# Patient Record
Sex: Female | Born: 1935 | Race: White | Hispanic: No | Smoking: Current every day smoker
Health system: Southern US, Community
[De-identification: ages and names within clinical notes are randomized; demographics above are authoritative.]

## PROBLEM LIST (undated history)

## (undated) DIAGNOSIS — M810 Age-related osteoporosis without current pathological fracture: Secondary | ICD-10-CM

## (undated) DIAGNOSIS — G4700 Insomnia, unspecified: Secondary | ICD-10-CM

## (undated) DIAGNOSIS — H3560 Retinal hemorrhage, unspecified eye: Secondary | ICD-10-CM

## (undated) DIAGNOSIS — M1990 Unspecified osteoarthritis, unspecified site: Secondary | ICD-10-CM

## (undated) DIAGNOSIS — F329 Major depressive disorder, single episode, unspecified: Secondary | ICD-10-CM

## (undated) DIAGNOSIS — E119 Type 2 diabetes mellitus without complications: Secondary | ICD-10-CM

## (undated) DIAGNOSIS — E785 Hyperlipidemia, unspecified: Secondary | ICD-10-CM

## (undated) DIAGNOSIS — E278 Other specified disorders of adrenal gland: Secondary | ICD-10-CM

## (undated) DIAGNOSIS — F32A Depression, unspecified: Secondary | ICD-10-CM

## (undated) DIAGNOSIS — J449 Chronic obstructive pulmonary disease, unspecified: Secondary | ICD-10-CM

## (undated) HISTORY — PX: BACK SURGERY: SHX140

## (undated) HISTORY — PX: CATARACT EXTRACTION, BILATERAL: SHX1313

## (undated) HISTORY — PX: GALLBLADDER SURGERY: SHX652

## (undated) HISTORY — PX: TONSILLECTOMY: SUR1361

## (undated) HISTORY — PX: APPENDECTOMY: SHX54

---

## 2004-07-31 ENCOUNTER — Ambulatory Visit: Payer: Self-pay | Admitting: Unknown Physician Specialty

## 2004-08-22 ENCOUNTER — Ambulatory Visit: Payer: Self-pay | Admitting: Internal Medicine

## 2005-07-22 ENCOUNTER — Ambulatory Visit: Payer: Self-pay

## 2005-08-13 ENCOUNTER — Ambulatory Visit: Payer: Self-pay | Admitting: Unknown Physician Specialty

## 2005-08-20 ENCOUNTER — Inpatient Hospital Stay: Payer: Self-pay | Admitting: Unknown Physician Specialty

## 2006-05-26 ENCOUNTER — Ambulatory Visit: Payer: Self-pay | Admitting: Gastroenterology

## 2006-07-02 ENCOUNTER — Ambulatory Visit: Payer: Self-pay | Admitting: Internal Medicine

## 2008-07-27 ENCOUNTER — Emergency Department: Payer: Self-pay | Admitting: Emergency Medicine

## 2008-08-01 ENCOUNTER — Ambulatory Visit: Payer: Self-pay | Admitting: Internal Medicine

## 2009-01-30 ENCOUNTER — Ambulatory Visit: Payer: Self-pay | Admitting: Unknown Physician Specialty

## 2009-09-02 ENCOUNTER — Ambulatory Visit: Payer: Self-pay | Admitting: Unknown Physician Specialty

## 2010-06-06 ENCOUNTER — Ambulatory Visit: Payer: Self-pay | Admitting: Internal Medicine

## 2011-06-18 ENCOUNTER — Ambulatory Visit: Payer: Self-pay

## 2011-10-30 ENCOUNTER — Ambulatory Visit: Payer: Self-pay | Admitting: Gastroenterology

## 2012-06-10 ENCOUNTER — Emergency Department: Payer: Self-pay | Admitting: Emergency Medicine

## 2012-09-14 ENCOUNTER — Ambulatory Visit: Payer: Self-pay | Admitting: Ophthalmology

## 2013-03-22 ENCOUNTER — Encounter: Payer: Self-pay | Admitting: Unknown Physician Specialty

## 2013-04-05 ENCOUNTER — Encounter: Payer: Self-pay | Admitting: Unknown Physician Specialty

## 2013-05-06 ENCOUNTER — Encounter: Payer: Self-pay | Admitting: Unknown Physician Specialty

## 2014-01-05 ENCOUNTER — Emergency Department: Payer: Self-pay | Admitting: Emergency Medicine

## 2014-01-05 LAB — CBC WITH DIFFERENTIAL/PLATELET
Basophil #: 0 10*3/uL (ref 0.0–0.1)
Basophil %: 0.8 %
Eosinophil #: 0.1 10*3/uL (ref 0.0–0.7)
Eosinophil %: 1.6 %
HCT: 43.4 % (ref 35.0–47.0)
HGB: 14.2 g/dL (ref 12.0–16.0)
Lymphocyte #: 2.4 10*3/uL (ref 1.0–3.6)
Lymphocyte %: 42.5 %
MCH: 30.6 pg (ref 26.0–34.0)
MCHC: 32.8 g/dL (ref 32.0–36.0)
MCV: 93 fL (ref 80–100)
MONOS PCT: 10.8 %
Monocyte #: 0.6 x10 3/mm (ref 0.2–0.9)
NEUTROS ABS: 2.5 10*3/uL (ref 1.4–6.5)
NEUTROS PCT: 44.3 %
Platelet: 200 10*3/uL (ref 150–440)
RBC: 4.65 10*6/uL (ref 3.80–5.20)
RDW: 13.2 % (ref 11.5–14.5)
WBC: 5.6 10*3/uL (ref 3.6–11.0)

## 2014-01-05 LAB — URINALYSIS, COMPLETE
Blood: NEGATIVE
Glucose,UR: 50 mg/dL (ref 0–75)
Ketone: NEGATIVE
Nitrite: NEGATIVE
PROTEIN: NEGATIVE
Ph: 7 (ref 4.5–8.0)
RBC,UR: 5 /HPF (ref 0–5)
Specific Gravity: 1.013 (ref 1.003–1.030)
Squamous Epithelial: NONE SEEN
WBC UR: 7 /HPF (ref 0–5)

## 2014-01-05 LAB — COMPREHENSIVE METABOLIC PANEL
ALT: 22 U/L (ref 12–78)
Albumin: 3.7 g/dL (ref 3.4–5.0)
Alkaline Phosphatase: 80 U/L
Anion Gap: 3 — ABNORMAL LOW (ref 7–16)
BUN: 12 mg/dL (ref 7–18)
Bilirubin,Total: 0.5 mg/dL (ref 0.2–1.0)
CO2: 32 mmol/L (ref 21–32)
CREATININE: 0.59 mg/dL — AB (ref 0.60–1.30)
Calcium, Total: 9.6 mg/dL (ref 8.5–10.1)
Chloride: 106 mmol/L (ref 98–107)
EGFR (African American): >60
EGFR (Non-African Amer.): >60
GLUCOSE: 70 mg/dL (ref 65–99)
OSMOLALITY: 279 (ref 275–301)
Potassium: 3.5 mmol/L (ref 3.5–5.1)
SGOT(AST): 27 U/L (ref 15–37)
Sodium: 141 mmol/L (ref 136–145)
Total Protein: 8 g/dL (ref 6.4–8.2)

## 2014-01-05 LAB — TROPONIN I

## 2014-01-05 LAB — LIPASE, BLOOD: LIPASE: 228 U/L (ref 73–393)

## 2014-01-06 LAB — URINE CULTURE

## 2014-05-17 LAB — CBC WITH DIFFERENTIAL/PLATELET
Basophil #: 0.2 10*3/uL — ABNORMAL HIGH (ref 0.0–0.1)
Basophil %: 1.3 %
Eosinophil #: 0 10*3/uL (ref 0.0–0.7)
Eosinophil %: 0.1 %
HCT: 47.6 % — AB (ref 35.0–47.0)
HGB: 15.6 g/dL (ref 12.0–16.0)
Lymphocyte #: 0.2 10*3/uL — ABNORMAL LOW (ref 1.0–3.6)
Lymphocyte %: 1.4 %
MCH: 30.9 pg (ref 26.0–34.0)
MCHC: 32.8 g/dL (ref 32.0–36.0)
MCV: 94 fL (ref 80–100)
MONO ABS: 0.9 x10 3/mm (ref 0.2–0.9)
Monocyte %: 5.3 %
Neutrophil #: 15.3 10*3/uL — ABNORMAL HIGH (ref 1.4–6.5)
Neutrophil %: 91.9 %
Platelet: 181 10*3/uL (ref 150–440)
RBC: 5.04 10*6/uL (ref 3.80–5.20)
RDW: 13.9 % (ref 11.5–14.5)
WBC: 16.6 10*3/uL — ABNORMAL HIGH (ref 3.6–11.0)

## 2014-05-17 LAB — COMPREHENSIVE METABOLIC PANEL
ALT: 22 U/L
ANION GAP: 11 (ref 7–16)
Albumin: 3.5 g/dL (ref 3.4–5.0)
Alkaline Phosphatase: 73 U/L
BILIRUBIN TOTAL: 0.7 mg/dL (ref 0.2–1.0)
BUN: 18 mg/dL (ref 7–18)
Calcium, Total: 9.1 mg/dL (ref 8.5–10.1)
Chloride: 106 mmol/L (ref 98–107)
Co2: 24 mmol/L (ref 21–32)
Creatinine: 0.73 mg/dL (ref 0.60–1.30)
EGFR (Non-African Amer.): >60
Glucose: 267 mg/dL — ABNORMAL HIGH (ref 65–99)
Osmolality: 293 (ref 275–301)
POTASSIUM: 4 mmol/L (ref 3.5–5.1)
SGOT(AST): 27 U/L (ref 15–37)
SODIUM: 141 mmol/L (ref 136–145)
TOTAL PROTEIN: 7.9 g/dL (ref 6.4–8.2)

## 2014-05-17 LAB — LIPASE, BLOOD: LIPASE: 103 U/L (ref 73–393)

## 2014-05-18 ENCOUNTER — Inpatient Hospital Stay: Payer: Self-pay | Admitting: Internal Medicine

## 2014-05-18 LAB — CBC WITH DIFFERENTIAL/PLATELET
Basophil #: 0 10*3/uL (ref 0.0–0.1)
Basophil %: 0.3 %
Eosinophil #: 0 10*3/uL (ref 0.0–0.7)
Eosinophil %: 0 %
HCT: 39.9 % (ref 35.0–47.0)
HGB: 13.3 g/dL (ref 12.0–16.0)
Lymphocyte #: 0.5 10*3/uL — ABNORMAL LOW (ref 1.0–3.6)
Lymphocyte %: 4.1 %
MCH: 31.2 pg (ref 26.0–34.0)
MCHC: 33.3 g/dL (ref 32.0–36.0)
MCV: 94 fL (ref 80–100)
Monocyte #: 0.6 x10 3/mm (ref 0.2–0.9)
Monocyte %: 4.8 %
Neutrophil #: 11.2 10*3/uL — ABNORMAL HIGH (ref 1.4–6.5)
Neutrophil %: 90.8 %
Platelet: 173 10*3/uL (ref 150–440)
RBC: 4.26 10*6/uL (ref 3.80–5.20)
RDW: 14 % (ref 11.5–14.5)
WBC: 12.4 10*3/uL — ABNORMAL HIGH (ref 3.6–11.0)

## 2014-05-18 LAB — URINALYSIS, COMPLETE
Bacteria: NONE SEEN
Bilirubin,UR: NEGATIVE
Glucose,UR: 50 mg/dL (ref 0–75)
Leukocyte Esterase: NEGATIVE
Nitrite: NEGATIVE
Ph: 5 (ref 4.5–8.0)
Protein: NEGATIVE
RBC,UR: NONE SEEN /HPF (ref 0–5)
Specific Gravity: 1.01 (ref 1.003–1.030)
Squamous Epithelial: NONE SEEN
WBC UR: NONE SEEN /HPF (ref 0–5)

## 2014-05-18 LAB — CLOSTRIDIUM DIFFICILE(ARMC)

## 2014-05-22 LAB — STOOL CULTURE

## 2014-05-23 LAB — CULTURE, BLOOD (SINGLE)

## 2014-09-04 ENCOUNTER — Ambulatory Visit: Payer: Self-pay | Admitting: Gastroenterology

## 2015-01-27 NOTE — H&P (Signed)
PATIENT NAME:  Katrina Jefferson, Katrina Jefferson MR#:  [id number] DATE OF BIRTH:  [birth year]  DATE OF ADMISSION:  05/18/2014  REFERRING PHYSICIAN: Conni Slipper, MD  PRIMARY CARE DOCTOR: Glendon Axe, MD  ADMISSION DIAGNOSIS: Gastroenteritis and sepsis.   HISTORY OF PRESENT ILLNESS: This is an 81 year old Caucasian female who presents to the Emergency Department with nausea and vomiting. The patient states that she had nonbloody, nonbilious emesis approximately 6 times this morning. She developed diarrhea as well, and had approximately 10 to 12 loose stools. She denies seeing frank blood or any dark-colored stools. She had been in her usual state of health until this morning. She states that her niece, with whom she lives, has had similar symptoms for the last 2 days. Now the patient states that she feels well, but the Emergency Department staff called the hospitalist service due to concern for possible sepsis.    REVIEW OF SYSTEMS:  CONSTITUTIONAL: The patient denies fever or weight loss.  EARS, NOSE, AND THROAT: The patient denies decreased visual acuity, but admits to persistent right-sided nasal congestion.  RESPIRATORY: The patient denies cough or shortness of breath.  CARDIOVASCULAR: The patient denies chest pain or palpitations.  GASTROINTESTINAL: The patient admits to nausea and vomiting, as well as diarrhea. She had experienced some abdominal pain, but this has resolved.  GENITOURINARY: The patient denies dysuria, increased frequency, or hesitancy.  MUSCULOSKELETAL: The patient denies myalgias, but admits to chronic joint pain.  SKIN: The patient admits to some lesions that have recently been removed and found to be benign by her dermatologist. She denies any rash at this time.  NEUROLOGIC: The patient denies headache, weakness, or dizziness.  PSYCHIATRIC: The patient denies suicidal ideation or homicidal ideation.    PAST MEDICAL HISTORY: Hyperlipidemia, osteoarthritis, COPD, and she has a known  left adrenal mass.   PAST SURGICAL HISTORY: Back surgery, cholecystectomy, shoulder arthroscopy with repair, cataract surgery and benign esophageal stricture dilatation.   MEDICATIONS:  1.  Temazepam 7.5 mg 1 capsule p.o. daily at bedtime as needed.  2.  Simvastatin 80 mg 1 tablet p.o. q.h.s. 3.  Pantoprazole 40 mg 1 tablet p.o. daily.  4.  Metformin 500 mg 1/2 tablet b.i.d.  5.  Lorazepam 0.5 mg 1 tablet p.o. at bedtime. (This must be a substitute for temazepam, as the patient would not have both of these concurrently.)  6.  Loratadine 10 mg 1 tablet p.o. daily.  7.  Etodolac 500 mg 1 tablet p.o. b.i.d.   ALLERGIES: SULFA DRUGS, CODEINE, AS WELL AS ERYTHROMYCIN.   PERTINENT LABORATORY RESULTS AND RADIOLOGIC FINDINGS: White blood cell count is 16.6 with 91.9% neutrophils. Glucose 267. Urinalysis negative.   CT of the abdomen shows a left adrenal adenoma and bilateral renal cysts. There are sigmoid diverticula, but no diverticulitis. There is no evidence of small bowel obstruction. The patient does have a hiatal hernia.   Chest x-ray shows no consolidation, but some diffuse bronchial cuffing, which may suggest acute bronchitis.    PHYSICAL EXAMINATION:  VITAL SIGNS: Temperature 97.5, pulse 107, respiratory rate 22, blood pressure 118/61, pulse oximetry 96% on 2 L of oxygen via nasal cannula.  GENERAL: The patient is alert and oriented x 3, and in no apparent distress.  HEENT: Normocephalic, atraumatic. Extraocular movements are intact. Pupils are equal, round, and reactive to light and accommodation.  She has moist mucous membranes. No oral exudates or erythema in the oropharynx.  NECK: Trachea is midline. No adenopathy.  CHEST: Symmetric and atraumatic.  CARDIOVASCULAR: Regular  rate and rhythm. Normal S1, S2. No rubs, clicks, or murmurs.  LUNGS: Clear to auscultation bilaterally. Normal effort and excursion.  ABDOMEN: Positive bowel sounds. Soft, nontender, nondistended. No  hepatosplenomegaly.  GENITOURINARY: Deferred.  MUSCULOSKELETAL: Has 5/5 strength in upper and lower extremities.   SKIN: No rashes, but the patient does have some healing wounds from which lesions that appear to be seborrheic dermatitis, that had been removed for biopsy.  EXTREMITIES: No clubbing, cyanosis, or edema.  NEUROLOGIC: Cranial nerves II through XII are grossly intact.  PSYCHIATRIC: Mood is normal. Affect is congruent.   ASSESSMENT AND PLAN: This is an 81 year old female with what appears to be a gastroenteritis.  1.  Gastroenteritis. Symptoms appear to have resolved and she is nontoxic-appearing; however, her CBC differential indicates this may be bacterial in origin. Thankfully she does not have any blood in her stool, nor does she have history of exposure to undercooked foods. I will continue ceftriaxone and vancomycin. Initially, the patient was n.p.o. for her imaging studies, but we will advance her diet as tolerated.  2.  Sepsis. The patient is afebrile, but she is tachycardic and tachypneic with this leukocytosis. She looks well, and her vital signs are stable at this point. I have been concerned about her respiratory rate. I have obtained a chest x-ray, which does not show any acute concern at this time. The peribronchial cuffing seems consistent with a viral etiology, which would make sense in that many viral gastroenteritis organisms are spread via respiratory droplets; however, we must still observe the patient for any type of sepsis.  3.  Deep vein thrombosis prophylaxis will be SCDs.  4.  Gastrointestinal prophylaxis. We will continue the patient's home medicines.   CODE STATUS: The patient is a Full Code.   TIME SPENT ON ADMISSION ORDERS AND PATIENT CARE: Approximately 30 minutes.    ____________________________ Norva Riffle. Marcille Blanco, MD msd:MT D: 05/18/2014 05:34:17 ET T: 05/18/2014 06:30:07 ET JOB#: [id number]  cc: Norva Riffle. Marcille Blanco, MD, <Dictator> Norva Riffle Tremon Sainvil  MD ELECTRONICALLY SIGNED 05/18/2014 7:57

## 2015-08-16 ENCOUNTER — Other Ambulatory Visit: Payer: Self-pay | Admitting: Gastroenterology

## 2015-08-16 DIAGNOSIS — R1310 Dysphagia, unspecified: Secondary | ICD-10-CM

## 2015-08-22 ENCOUNTER — Ambulatory Visit
Admission: RE | Admit: 2015-08-22 | Discharge: 2015-08-22 | Disposition: A | Discharge status: Home / Self Care | Payer: Medicare Other | Source: Ambulatory Visit | Attending: Gastroenterology | Admitting: Gastroenterology

## 2015-08-22 DIAGNOSIS — Z9049 Acquired absence of other specified parts of digestive tract: Secondary | ICD-10-CM | POA: Insufficient documentation

## 2015-08-22 DIAGNOSIS — K228 Other specified diseases of esophagus: Secondary | ICD-10-CM | POA: Insufficient documentation

## 2015-08-22 DIAGNOSIS — R1310 Dysphagia, unspecified: Secondary | ICD-10-CM | POA: Diagnosis present

## 2015-08-22 DIAGNOSIS — K449 Diaphragmatic hernia without obstruction or gangrene: Secondary | ICD-10-CM | POA: Insufficient documentation

## 2015-09-14 ENCOUNTER — Encounter: Payer: Self-pay | Admitting: *Deleted

## 2015-09-17 ENCOUNTER — Ambulatory Visit: Payer: Medicare Other | Admitting: Anesthesiology

## 2015-09-17 ENCOUNTER — Encounter
Admission: RE | Disposition: A | Discharge status: Home / Self Care | Payer: Self-pay | Source: Ambulatory Visit | Attending: Gastroenterology

## 2015-09-17 ENCOUNTER — Ambulatory Visit
Admission: RE | Admit: 2015-09-17 | Discharge: 2015-09-17 | Disposition: A | Discharge status: Home / Self Care | Payer: Medicare Other | Source: Ambulatory Visit | Attending: Gastroenterology | Admitting: Gastroenterology

## 2015-09-17 DIAGNOSIS — Z882 Allergy status to sulfonamides status: Secondary | ICD-10-CM | POA: Insufficient documentation

## 2015-09-17 DIAGNOSIS — E119 Type 2 diabetes mellitus without complications: Secondary | ICD-10-CM | POA: Diagnosis not present

## 2015-09-17 DIAGNOSIS — K449 Diaphragmatic hernia without obstruction or gangrene: Secondary | ICD-10-CM | POA: Insufficient documentation

## 2015-09-17 DIAGNOSIS — F329 Major depressive disorder, single episode, unspecified: Secondary | ICD-10-CM | POA: Insufficient documentation

## 2015-09-17 DIAGNOSIS — F17210 Nicotine dependence, cigarettes, uncomplicated: Secondary | ICD-10-CM | POA: Diagnosis not present

## 2015-09-17 DIAGNOSIS — J449 Chronic obstructive pulmonary disease, unspecified: Secondary | ICD-10-CM | POA: Insufficient documentation

## 2015-09-17 DIAGNOSIS — K222 Esophageal obstruction: Secondary | ICD-10-CM | POA: Diagnosis present

## 2015-09-17 DIAGNOSIS — E785 Hyperlipidemia, unspecified: Secondary | ICD-10-CM | POA: Insufficient documentation

## 2015-09-17 DIAGNOSIS — Z7984 Long term (current) use of oral hypoglycemic drugs: Secondary | ICD-10-CM | POA: Insufficient documentation

## 2015-09-17 DIAGNOSIS — Z79899 Other long term (current) drug therapy: Secondary | ICD-10-CM | POA: Diagnosis not present

## 2015-09-17 DIAGNOSIS — R1310 Dysphagia, unspecified: Secondary | ICD-10-CM | POA: Insufficient documentation

## 2015-09-17 DIAGNOSIS — Z886 Allergy status to analgesic agent status: Secondary | ICD-10-CM | POA: Diagnosis not present

## 2015-09-17 DIAGNOSIS — Z885 Allergy status to narcotic agent status: Secondary | ICD-10-CM | POA: Diagnosis not present

## 2015-09-17 DIAGNOSIS — Z881 Allergy status to other antibiotic agents status: Secondary | ICD-10-CM | POA: Insufficient documentation

## 2015-09-17 HISTORY — DX: Chronic obstructive pulmonary disease, unspecified: J44.9

## 2015-09-17 HISTORY — DX: Retinal hemorrhage, unspecified eye: H35.60

## 2015-09-17 HISTORY — DX: Other specified disorders of adrenal gland: E27.8

## 2015-09-17 HISTORY — DX: Depression, unspecified: F32.A

## 2015-09-17 HISTORY — DX: Type 2 diabetes mellitus without complications: E11.9

## 2015-09-17 HISTORY — DX: Unspecified osteoarthritis, unspecified site: M19.90

## 2015-09-17 HISTORY — DX: Age-related osteoporosis without current pathological fracture: M81.0

## 2015-09-17 HISTORY — DX: Hyperlipidemia, unspecified: E78.5

## 2015-09-17 HISTORY — DX: Major depressive disorder, single episode, unspecified: F32.9

## 2015-09-17 HISTORY — DX: Insomnia, unspecified: G47.00

## 2015-09-17 HISTORY — PX: ESOPHAGOGASTRODUODENOSCOPY (EGD) WITH PROPOFOL: SHX5813

## 2015-09-17 LAB — GLUCOSE, CAPILLARY: Glucose-Capillary: 125 mg/dL — ABNORMAL HIGH (ref 65–99)

## 2015-09-17 SURGERY — ESOPHAGOGASTRODUODENOSCOPY (EGD) WITH PROPOFOL
Anesthesia: General

## 2015-09-17 MED ORDER — LABETALOL HCL 5 MG/ML IV SOLN
INTRAVENOUS | Status: DC | PRN
  Administered 2015-09-17: 5 mg via INTRAVENOUS

## 2015-09-17 MED ORDER — SODIUM CHLORIDE 0.9 % IV SOLN
INTRAVENOUS | Status: DC
  Administered 2015-09-17: 1000 mL via INTRAVENOUS

## 2015-09-17 MED ORDER — PROPOFOL 10 MG/ML IV BOLUS
INTRAVENOUS | Status: DC | PRN
  Administered 2015-09-17: 10 mg via INTRAVENOUS
  Administered 2015-09-17 (×2): 20 mg via INTRAVENOUS
  Administered 2015-09-17 (×4): 10 mg via INTRAVENOUS
  Administered 2015-09-17: 20 mg via INTRAVENOUS
  Administered 2015-09-17: 10 mg via INTRAVENOUS

## 2015-09-17 NOTE — Transfer of Care (Signed)
Immediate Anesthesia Transfer of Care Note  Patient: Katrina Jefferson  Procedure(s) Performed: Procedure(s): ESOPHAGOGASTRODUODENOSCOPY (EGD) WITH PROPOFOL (N/A)  Patient Location: PACU  Anesthesia Type:General  Level of Consciousness: awake, alert  and oriented  Airway & Oxygen Therapy: Patient Spontanous Breathing and Patient connected to nasal cannula oxygen  Post-op Assessment: Report given to RN and Post -op Vital signs reviewed and stable  Post vital signs: Reviewed and stable  Last Vitals:  Filed Vitals:   09/17/15 0924  BP: 162/80  Pulse: 77  Temp: 36 C  Resp: 16    Complications: No apparent anesthesia complications

## 2015-09-17 NOTE — H&P (Signed)
Primary Care Physician:  Glendon Axe, MD  Pre-Procedure History & Physical: HPI:  Katrina Jefferson is a 81 y.o. female is here for an endoscopy.   Past Medical History  Diagnosis Date  . COPD (chronic obstructive pulmonary disease) ([place])   . Arthritis   . Depression   . Diabetes mellitus without complication ([place])   . Hyperlipidemia   . Insomnia   . Adrenal mass, left ([place])   . Osteoporosis   . Retinal hemorrhage     History reviewed. No pertinent past surgical history.  Prior to Admission medications   Medication Sig Start Date End Date Taking? Authorizing Provider  acetaminophen (TYLENOL) 650 MG CR tablet Take 1,300 mg by mouth 2 (two) times daily.   Yes Historical Provider, MD  clobetasol cream (TEMOVATE) [id number] % Apply 1 application topically 2 (two) times daily.   Yes Historical Provider, MD  escitalopram (LEXAPRO) 10 MG tablet Take 10 mg by mouth daily.   Yes Historical Provider, MD  fluticasone (FLONASE) 50 MCG/ACT nasal spray Place into both nostrils daily.   Yes Historical Provider, MD  HYDROcodone-acetaminophen (NORCO/VICODIN) 5-325 MG tablet Take 1 tablet by mouth 2 (two) times daily.   Yes Historical Provider, MD  Loratadine (CLARITIN) 10 MG CAPS Take 10 mg by mouth daily.   Yes Historical Provider, MD  metFORMIN (GLUCOPHAGE) 500 MG tablet Take by mouth 2 (two) times daily with a meal.   Yes Historical Provider, MD  omeprazole (PRILOSEC) 40 MG capsule Take 40 mg by mouth daily.   Yes Historical Provider, MD  promethazine (PHENERGAN) 25 MG tablet Take 25 mg by mouth at bedtime as needed for nausea or vomiting.   Yes Historical Provider, MD  ranitidine (ZANTAC) 75 MG tablet Take 75 mg by mouth 2 (two) times daily.   Yes Historical Provider, MD  simvastatin (ZOCOR) 40 MG tablet Take 40 mg by mouth daily.   Yes Historical Provider, MD  temazepam (RESTORIL) 15 MG capsule Take 15 mg by mouth at bedtime as needed for sleep.   Yes Historical Provider, MD    Allergies as  of 09/14/2015 - Review Complete 09/14/2015  Allergen Reaction Noted  . Codeine sulfate  09/14/2015  . Erythromycin  09/14/2015  . Sulfa antibiotics  09/14/2015  . Ultracet [tramadol-acetaminophen]  09/14/2015    History reviewed. No pertinent family history.  Social History   Social History  . Marital Status: Widowed    Spouse Name: N/A  . Number of Children: N/A  . Years of Education: N/A   Occupational History  . Not on file.   Social History Main Topics  . Smoking status: Current Every Day Smoker -- 0.50 packs/day  . Smokeless tobacco: Never Used  . Alcohol Use: No  . Drug Use: No  . Sexual Activity: Not on file   Other Topics Concern  . Not on file   Social History Narrative  . No narrative on file     Physical Exam: BP 162/80 mmHg  Pulse 77  Temp(Src) 96.8 F (36 C) (Tympanic)  Resp 16  Ht 5' 0.5" (1.537 m)  Wt 63.05 kg (139 lb)  BMI 26.69 kg/m2  SpO2 96% General:   Alert,  pleasant and cooperative in NAD Head:  Normocephalic and atraumatic. Neck:  Supple; no masses or thyromegaly. Lungs:  Clear throughout to auscultation.    Heart:  Regular rate and rhythm. Abdomen:  Soft, nontender and nondistended. Normal bowel sounds, without guarding, and without rebound.   Neurologic:  Alert and  oriented x4;  grossly normal neurologically.  Impression/Plan: Katrina Jefferson is here for an endoscopy to be performed for dysphagia, Schatzki Ring on barium swallow  Risks, benefits, limitations, and alternatives regarding  endoscopy have been reviewed with the patient.  Questions have been answered.  All parties agreeable.   Josefine Class, MD  09/17/2015, 10:39 AM

## 2015-09-17 NOTE — Anesthesia Preprocedure Evaluation (Signed)
Anesthesia Evaluation  Patient identified by MRN, date of birth, ID band Patient awake    Reviewed: Allergy & Precautions, NPO status , Patient's Chart, lab work & pertinent test results  Airway Mallampati: II       Dental  (+) Upper Dentures   Pulmonary COPD, Current Smoker,     + decreased breath sounds      Cardiovascular Exercise Tolerance: Good  Rhythm:Regular     Neuro/Psych    GI/Hepatic negative GI ROS, Neg liver ROS,   Endo/Other  diabetes, Type 2, Oral Hypoglycemic Agents  Renal/GU negative Renal ROS     Musculoskeletal   Abdominal   Peds  Hematology   Anesthesia Other Findings   Reproductive/Obstetrics                             Anesthesia Physical Anesthesia Plan  ASA: III  Anesthesia Plan: General   Post-op Pain Management:    Induction: Intravenous  Airway Management Planned: Nasal Cannula  Additional Equipment:   Intra-op Plan:   Post-operative Plan:   Informed Consent: I have reviewed the patients History and Physical, chart, labs and discussed the procedure including the risks, benefits and alternatives for the proposed anesthesia with the patient or authorized representative who has indicated his/her understanding and acceptance.     Plan Discussed with: CRNA  Anesthesia Plan Comments:         Anesthesia Quick Evaluation

## 2015-09-17 NOTE — Anesthesia Postprocedure Evaluation (Signed)
Anesthesia Post Note  Patient: Katrina Jefferson  Procedure(s) Performed: Procedure(s) (LRB): ESOPHAGOGASTRODUODENOSCOPY (EGD) WITH PROPOFOL (N/A)  Patient location during evaluation: PACU Anesthesia Type: General Level of consciousness: awake and alert Pain management: pain level controlled Vital Signs Assessment: post-procedure vital signs reviewed and stable Respiratory status: respiratory function stable Cardiovascular status: stable Anesthetic complications: no    Last Vitals:  Filed Vitals:   09/17/15 1112 09/17/15 1120  BP: 154/85 158/69  Pulse: 82 72  Temp: 36.1 C   Resp: 23 23    Last Pain: There were no vitals filed for this visit.               VAN STAVEREN,Logen Fowle

## 2015-09-17 NOTE — Op Note (Signed)
[facility] Gastroenterology Patient Name: Katrina Jefferson Procedure Date: 09/17/2015 10:37 AM MRN: [id number] Account #: [id number] Date of Birth: 11/25/1923 Admit Type: Outpatient Age: 81 Room: [facility] ENDO ROOM 2 Gender: Female Note Status: Finalized Procedure:         Upper GI endoscopy Indications:       Dysphagia, , Abnormal UGI series ( Schatzki Ring),                     Significant improvement with previous dilation. Patient Profile:   This is an 81 year old female. Providers:         Gerrit Heck. Rayann Heman, MD Referring MD:      Glendon Axe (Referring MD) Medicines:         Propofol per Anesthesia Complications:     No immediate complications. Procedure:         Pre-Anesthesia Assessment:                    - Prior to the procedure, a History and Physical was                     performed, and patient medications, allergies and                     sensitivities were reviewed. The patient's tolerance of                     previous anesthesia was reviewed.                    After obtaining informed consent, the endoscope was passed                     under direct vision. Throughout the procedure, the                     patient's blood pressure, pulse, and oxygen saturations                     were monitored continuously. The Endoscope was introduced                     through the mouth, and advanced to the second part of                     duodenum. The upper GI endoscopy was accomplished without                     difficulty. The patient tolerated the procedure well. Findings:      A medium-sized hiatus hernia was present.      A widely patent Schatzki ring (acquired) was found at the       gastroesophageal junction. A TTS dilator was passed through the scope.       Dilation with an 18-19-20 mm x 5.5 cm CRE balloon (to a maximum balloon       size of 20 mm) dilator was performed. Heme produced at 20 mm, no heme at       18 mm.      The stomach was  normal.      The examined duodenum was normal. Impression:        - Medium-sized hiatus hernia.                    - Widely  patent Schatzki ring. Dilated. Small mucosal tear                     with heme produced at 20 mm. No heme or resistance at 18                     mm.                    - Normal stomach.                    - Normal examined duodenum.                    - No specimens collected. Recommendation:    - Observe patient in GI recovery unit.                    - Continue present medications.                    - Cont prilosec 40 mg daily                    - The findings and recommendations were discussed with the                     patient.                    - The findings and recommendations were discussed with the                     patient's family. Procedure Code(s): --- Professional ---                    [phone], Esophagogastroduodenoscopy, flexible, transoral;                     with transendoscopic balloon dilation of esophagus (less                     than 30 mm diameter) Diagnosis Code(s): --- Professional ---                    K44.9, Diaphragmatic hernia without obstruction or gangrene                    K22.2, Esophageal obstruction                    R13.10, Dysphagia, unspecified                    R93.3, Abnormal findings on diagnostic imaging of other                     parts of digestive tract CPT copyright 2014 [employer]. All rights reserved. The codes documented in this report are preliminary and upon coder review may  be revised to meet current compliance requirements. Mellody Life, MD 09/17/2015 11:09:24 AM This report has been signed electronically. Number of Addenda: 0 Note Initiated On: 09/17/2015 10:37 AM      [facility]

## 2015-09-18 ENCOUNTER — Emergency Department
Admission: EM | Admit: 2015-09-18 | Discharge: 2015-09-18 | Disposition: A | Discharge status: Home / Self Care | Payer: Medicare Other | Attending: Student | Admitting: Student

## 2015-09-18 ENCOUNTER — Emergency Department: Payer: Medicare Other

## 2015-09-18 ENCOUNTER — Encounter: Payer: Self-pay | Admitting: Medical Oncology

## 2015-09-18 DIAGNOSIS — R079 Chest pain, unspecified: Secondary | ICD-10-CM | POA: Diagnosis not present

## 2015-09-18 DIAGNOSIS — F17200 Nicotine dependence, unspecified, uncomplicated: Secondary | ICD-10-CM | POA: Diagnosis not present

## 2015-09-18 DIAGNOSIS — R1012 Left upper quadrant pain: Secondary | ICD-10-CM | POA: Diagnosis not present

## 2015-09-18 DIAGNOSIS — Z79899 Other long term (current) drug therapy: Secondary | ICD-10-CM | POA: Diagnosis not present

## 2015-09-18 DIAGNOSIS — Z7951 Long term (current) use of inhaled steroids: Secondary | ICD-10-CM | POA: Diagnosis not present

## 2015-09-18 DIAGNOSIS — R1013 Epigastric pain: Secondary | ICD-10-CM

## 2015-09-18 DIAGNOSIS — E119 Type 2 diabetes mellitus without complications: Secondary | ICD-10-CM | POA: Insufficient documentation

## 2015-09-18 DIAGNOSIS — Z7984 Long term (current) use of oral hypoglycemic drugs: Secondary | ICD-10-CM | POA: Insufficient documentation

## 2015-09-18 DIAGNOSIS — R531 Weakness: Secondary | ICD-10-CM | POA: Diagnosis present

## 2015-09-18 LAB — HEPATIC FUNCTION PANEL
ALBUMIN: 4.2 g/dL (ref 3.5–5.0)
ALK PHOS: 69 U/L (ref 38–126)
ALT: 20 U/L (ref 14–54)
AST: 26 U/L (ref 15–41)
BILIRUBIN TOTAL: 0.4 mg/dL (ref 0.3–1.2)
Bilirubin, Direct: <0.1 mg/dL — ABNORMAL LOW (ref 0.1–0.5)
TOTAL PROTEIN: 7.9 g/dL (ref 6.5–8.1)

## 2015-09-18 LAB — CBC
HEMATOCRIT: 43 % (ref 35.0–47.0)
Hemoglobin: 13.9 g/dL (ref 12.0–16.0)
MCH: 30 pg (ref 26.0–34.0)
MCHC: 32.4 g/dL (ref 32.0–36.0)
MCV: 92.5 fL (ref 80.0–100.0)
PLATELETS: 216 10*3/uL (ref 150–440)
RBC: 4.64 MIL/uL (ref 3.80–5.20)
RDW: 14.1 % (ref 11.5–14.5)
WBC: 4.5 10*3/uL (ref 3.6–11.0)

## 2015-09-18 LAB — BASIC METABOLIC PANEL
Anion gap: 11 (ref 5–15)
BUN: 12 mg/dL (ref 6–20)
CHLORIDE: 99 mmol/L — AB (ref 101–111)
CO2: 30 mmol/L (ref 22–32)
Calcium: 9.9 mg/dL (ref 8.9–10.3)
Creatinine, Ser: 0.48 mg/dL (ref 0.44–1.00)
Glucose, Bld: 159 mg/dL — ABNORMAL HIGH (ref 65–99)
POTASSIUM: 4.2 mmol/L (ref 3.5–5.1)
SODIUM: 140 mmol/L (ref 135–145)

## 2015-09-18 LAB — GLUCOSE, CAPILLARY: Glucose-Capillary: 150 mg/dL — ABNORMAL HIGH (ref 65–99)

## 2015-09-18 LAB — LIPASE, BLOOD: Lipase: 20 U/L (ref 11–51)

## 2015-09-18 LAB — TROPONIN I: Troponin I: <0.03 ng/mL (ref <0.031)

## 2015-09-18 MED ORDER — IOHEXOL 300 MG/ML  SOLN
75.0000 mL | Freq: Once | INTRAMUSCULAR | Status: AC | PRN
  Administered 2015-09-18: 75 mL via INTRAVENOUS

## 2015-09-18 MED ORDER — SUCRALFATE 1 G PO TABS
1.0000 g | ORAL_TABLET | Freq: Once | ORAL | Status: AC
  Administered 2015-09-18: 1 g via ORAL
  Filled 2015-09-18: qty 1

## 2015-09-18 NOTE — ED Notes (Signed)
Patient transported to CT 

## 2015-09-18 NOTE — ED Notes (Signed)
Pt reports she had EGD yesterday with esophagus stretched. Since procedure pt has been having central chest pain that has been so severe pt reports she feels like she is going to pass out.

## 2015-09-18 NOTE — ED Notes (Signed)
Pt in xray

## 2015-09-18 NOTE — ED Notes (Signed)
MD at bedside. 

## 2015-09-18 NOTE — ED Provider Notes (Signed)
[facility] Emergency Department Provider Note  ____________________________________________  Time seen: Approximately 1:16 PM  I have reviewed the triage vital signs and the nursing notes.   HISTORY  Chief Complaint Chest Pain and Weakness    HPI Katrina Jefferson is a 81 y.o. female with hyperlipidemia, arthritis, COPD who presents for evaluation of epigastric and left upper quadrant pain radiating to the back, constant since yesterday, worse last night while trying to sleep, now improved without any intervention, currently moderate. Patient reports that she has had similar pain in the past leading up to her having endoscopy and  dilation of a Schatzki's ring yesterday performed by Dr.Rein of GI. She reports that she had the pain before the procedure however since the procedure, the pain has worsened. Worse when she lies flat, improves with her sitting up. Pain is worse with eating. No nausea, vomiting, diarrhea, fevers or chills. She has not had a bowel movement since the procedure but is able to pass flatus. No chest pain or difficulty breathing.   Past Medical History  Diagnosis Date  . COPD (chronic obstructive pulmonary disease) ([place])   . Arthritis   . Depression   . Diabetes mellitus without complication ([place])   . Hyperlipidemia   . Insomnia   . Adrenal mass, left ([place])   . Osteoporosis   . Retinal hemorrhage     There are no active problems to display for this patient.   History reviewed. No pertinent past surgical history.  Current Outpatient Rx  Name  Route  Sig  Dispense  Refill  . acetaminophen (TYLENOL) 650 MG CR tablet   Oral   Take 1,300 mg by mouth 2 (two) times daily.         . clobetasol cream (TEMOVATE) 0.05 %   Topical   Apply 1 application topically 2 (two) times daily.         Marland Kitchen escitalopram (LEXAPRO) 10 MG tablet   Oral   Take 10 mg by mouth daily.         . fluticasone (FLONASE) 50 MCG/ACT nasal spray   Each  Nare   Place into both nostrils daily.         Marland Kitchen HYDROcodone-acetaminophen (NORCO/VICODIN) 5-325 MG tablet   Oral   Take 1 tablet by mouth 2 (two) times daily.         . Loratadine (CLARITIN) 10 MG CAPS   Oral   Take 10 mg by mouth daily.         . metFORMIN (GLUCOPHAGE) 500 MG tablet   Oral   Take by mouth 2 (two) times daily with a meal.         . omeprazole (PRILOSEC) 40 MG capsule   Oral   Take 40 mg by mouth daily.         . promethazine (PHENERGAN) 25 MG tablet   Oral   Take 25 mg by mouth at bedtime as needed for nausea or vomiting.         . ranitidine (ZANTAC) 75 MG tablet   Oral   Take 75 mg by mouth 2 (two) times daily.         . simvastatin (ZOCOR) 40 MG tablet   Oral   Take 40 mg by mouth daily.         . temazepam (RESTORIL) 15 MG capsule   Oral   Take 15 mg by mouth at bedtime as needed for sleep.  Allergies Codeine sulfate; Erythromycin; Sulfa antibiotics; and Ultracet  No family history on file.  Social History Social History  Substance Use Topics  . Smoking status: Current Every Day Smoker -- 0.50 packs/day  . Smokeless tobacco: Never Used  . Alcohol Use: No    Review of Systems Constitutional: No fever/chills Eyes: No visual changes. ENT: No sore throat. Cardiovascular: Denies chest pain. Respiratory: Denies shortness of breath. Gastrointestinal: + abdominal pain.  No nausea, no vomiting.  No diarrhea.  No constipation. Genitourinary: Negative for dysuria. Musculoskeletal: Negative for back pain. Skin: Negative for rash. Neurological: Negative for headaches, focal weakness or numbness.  10-point ROS otherwise negative.  ____________________________________________   PHYSICAL EXAM:  VITAL SIGNS: ED Triage Vitals  Enc Vitals Group     BP 09/18/15 1200 172/73 mmHg     Pulse Rate 09/18/15 1200 88     Resp 09/18/15 1200 18     Temp 09/18/15 1200 97.8 F (36.6 C)     Temp src --      SpO2 09/18/15  1200 93 %     Weight 09/18/15 1200 139 lb (63.05 kg)     Height --      Head Cir --      Peak Flow --      Pain Score 09/18/15 1201 8     Pain Loc --      Pain Edu? --      Excl. in [place]? --     Constitutional: Alert and oriented. Well appearing and in no acute distress. Eyes: Conjunctivae are normal. PERRL. EOMI. Head: Atraumatic. Nose: No congestion/rhinnorhea. Mouth/Throat: Mucous membranes are moist.  Oropharynx non-erythematous. Neck: No stridor.   Cardiovascular: Normal rate, regular rhythm. Grossly normal heart sounds.  Good peripheral circulation. Respiratory: Normal respiratory effort.  No retractions. Lungs CTAB. Gastrointestinal: Soft, nontender nondistended, slightly diminished bowel sounds. No CVA tenderness. Genitourinary: deferred Musculoskeletal: No lower extremity tenderness nor edema.  No joint effusions. Neurologic:  Normal speech and language. No gross focal neurologic deficits are appreciated. No gait instability. Skin:  Skin is warm, dry and intact. No rash noted. Psychiatric: Mood and affect are normal. Speech and behavior are normal.  ____________________________________________   LABS (all labs ordered are listed, but only abnormal results are displayed)  Labs Reviewed  GLUCOSE, CAPILLARY - Abnormal; Notable for the following:    Glucose-Capillary 150 (*)    All other components within normal limits  BASIC METABOLIC PANEL - Abnormal; Notable for the following:    Chloride 99 (*)    Glucose, Bld 159 (*)    All other components within normal limits  HEPATIC FUNCTION PANEL - Abnormal; Notable for the following:    Bilirubin, Direct <0.1 (*)    All other components within normal limits  CBC  TROPONIN I  LIPASE, BLOOD   ____________________________________________  EKG  ED ECG REPORT I, Joanne Gavel, the attending physician, personally viewed and interpreted this ECG.   Date: 09/18/2015  EKG Time: 11:58  Rate: 88  Rhythm: normal sinus  rhythm  Axis: normal  Intervals:none  ST&T Change: No acute ST elevation. Minimal voltage criteria for LVH.  ____________________________________________  RADIOLOGY  CXR IMPRESSION: 1. Small right pleural effusion and associated airspace disease. This likely reflects atelectasis. 2. Emphysema with chronic interstitial coarsening.  CT chest and abdomen with contrast  FINDINGS: CT CHEST WITH CONTRAST  Mediastinum/Lymph Nodes: No breast masses, supraclavicular or axillary lymphadenopathy. Thyroid goiter with a dominant 2.3 cm right thyroid lesion. Recommend ultrasound correlation  and follow-up. Patient may require an FNA.  The heart is normal in size. No pericardial effusion. Moderate atherosclerotic calcifications involving the aorta but no aneurysm or dissection. The coronary arteries demonstrate atherosclerotic calcifications.  No mediastinal or hilar mass or adenopathy. No abnormal mediastinal fluid collections/hematoma.  Moderate to large hiatal hernia. No findings for esophageal perforation/leak.  Lungs/Pleura: No pneumothorax or pleural effusion. Moderate emphysematous changes and pulmonary scarring. No infiltrates or effusions. No worrisome pulmonary lesions.  Musculoskeletal: Advanced degenerative changes and osteoporosis involving the thoracic spine. No acute bony findings or destructive bony lesions.  CT ABDOMEN WITH CONTRAST  Hepatobiliary: No focal hepatic lesions or intrahepatic biliary dilatation. The gallbladder is surgically absent. No common bile duct dilatation.  Pancreas: No mass, inflammation or ductal dilatation.  Spleen: Normal size. No focal lesions.  Adrenals/Urinary Tract: Stable left adrenal gland nodule most consistent with a benign adenoma. The right adrenal gland is normal. There are small renal cysts. No worrisome renal lesions. A lower pole left renal calculus is noted.  Stomach/Bowel: The stomach, duodenum, small bowel and  colon are unremarkable. No inflammatory changes, mass lesions or obstructive findings. Scattered colonic diverticulosis without findings for acute diverticulitis. There is a large diverticuli near the left kidney which probably contains retained barium.  Vascular/Lymphatic: No mesenteric or retroperitoneal mass or lymphadenopathy. Small scattered lymph nodes are noted. The aorta demonstrates advanced atherosclerotic calcifications. The major branch vessels are patent. The major venous structures are patent.  Other: No ascites or abdominal wall hernia.  Musculoskeletal: Small left-sided chest wall lipoma. Advanced degenerative changes involving the lumbar spine.  IMPRESSION: 1. Moderate to large hiatal hernia. No findings for esophageal perforation. 2. Emphysematous changes but no acute pulmonary findings. 3. Advanced atherosclerotic calcifications involving the major vascular structures of the chest and abdomen. 4. Stable left adrenal gland adenoma. 5. Lower pole left renal calculus. ____________________________________________   PROCEDURES  Procedure(s) performed: None  Critical Care performed: No  ____________________________________________   INITIAL IMPRESSION / ASSESSMENT AND PLAN / ED COURSE  Pertinent labs & imaging results that were available during my care of the patient were reviewed by me and considered in my medical decision making (see chart for details).  Domenic Schwab Hynek is a 81 y.o. female with hyperlipidemia, arthritis, COPD who presents for evaluation of epigastric and left upper quadrant pain radiating to the back, constant since yesterday, worse last night while trying to sleep and worse with eating. On exam, she is very well-appearing and in no acute distress. Vital signs are stable. She is afebrile. Labs reviewed and are generally unremarkable. Troponin negative, EKG not this with acute ischemia and given pain constant since onset, modifying factors  such as lying down in eating, doubt ACS. Additionally troponin is negative and pain has been ongoing and constant for greater than 6 hours. Given procedure yesterday the mostly likely causative factor in terms of worsening pain, I discussed the case with Dr. Rayann Heman of GI who has spoken with radiologist who recommends contrasted CT with Gastrografin to evaluate for complication such as esophageal perforation. Doubt acute aortic dissection but CT will allow eval of aortic contours as well. Chest x-ray is negative. Awaiting CT scan. Reassess for disposition.  ----------------------------------------- 3:34 PM on 09/18/2015 ----------------------------------------- Patient reports she feels well at this time. She is sitting up in bed and in no acute distress. She is not requiring any medication for pain. CT scan of the chest and abdomen shows hiatal hernia, no esophageal perforation, no appreciable aortic aneurysm or dissection.  Atherosclerotic calcifications are noted in involving the major vascular structures of the chest and abdomen however, as above, doubt ACS. We discussed return precautions, need for close PCP and GI follow-up and the patient is comfortable with the discharge plan. DC home. Now, blood pressure is moderately elevated however she's not had any of her home medications today. Encouraged her to take her home antihypertensives.  ____________________________________________   FINAL CLINICAL IMPRESSION(S) / ED DIAGNOSES  Final diagnoses:  Epigastric pain      Joanne Gavel, MD 09/18/15 [id number]

## 2015-09-19 ENCOUNTER — Encounter: Payer: Self-pay | Admitting: Gastroenterology

## 2015-11-15 ENCOUNTER — Encounter: Payer: Self-pay | Admitting: Emergency Medicine

## 2015-11-15 ENCOUNTER — Emergency Department: Payer: Medicare Other

## 2015-11-15 ENCOUNTER — Emergency Department
Admission: EM | Admit: 2015-11-15 | Discharge: 2015-11-15 | Disposition: A | Discharge status: Home / Self Care | Payer: Medicare Other | Attending: Student | Admitting: Student

## 2015-11-15 DIAGNOSIS — Z7984 Long term (current) use of oral hypoglycemic drugs: Secondary | ICD-10-CM | POA: Insufficient documentation

## 2015-11-15 DIAGNOSIS — Y9289 Other specified places as the place of occurrence of the external cause: Secondary | ICD-10-CM | POA: Insufficient documentation

## 2015-11-15 DIAGNOSIS — E119 Type 2 diabetes mellitus without complications: Secondary | ICD-10-CM | POA: Diagnosis not present

## 2015-11-15 DIAGNOSIS — W1839XA Other fall on same level, initial encounter: Secondary | ICD-10-CM | POA: Diagnosis not present

## 2015-11-15 DIAGNOSIS — S60221A Contusion of right hand, initial encounter: Secondary | ICD-10-CM | POA: Insufficient documentation

## 2015-11-15 DIAGNOSIS — W19XXXA Unspecified fall, initial encounter: Secondary | ICD-10-CM

## 2015-11-15 DIAGNOSIS — Z79899 Other long term (current) drug therapy: Secondary | ICD-10-CM | POA: Insufficient documentation

## 2015-11-15 DIAGNOSIS — F17200 Nicotine dependence, unspecified, uncomplicated: Secondary | ICD-10-CM | POA: Insufficient documentation

## 2015-11-15 DIAGNOSIS — S60211A Contusion of right wrist, initial encounter: Secondary | ICD-10-CM | POA: Diagnosis not present

## 2015-11-15 DIAGNOSIS — S5011XA Contusion of right forearm, initial encounter: Secondary | ICD-10-CM | POA: Diagnosis not present

## 2015-11-15 DIAGNOSIS — S52591A Other fractures of lower end of right radius, initial encounter for closed fracture: Secondary | ICD-10-CM | POA: Insufficient documentation

## 2015-11-15 DIAGNOSIS — Y990 Civilian activity done for income or pay: Secondary | ICD-10-CM | POA: Diagnosis not present

## 2015-11-15 DIAGNOSIS — Z7951 Long term (current) use of inhaled steroids: Secondary | ICD-10-CM | POA: Insufficient documentation

## 2015-11-15 DIAGNOSIS — S5291XA Unspecified fracture of right forearm, initial encounter for closed fracture: Secondary | ICD-10-CM

## 2015-11-15 DIAGNOSIS — S6991XA Unspecified injury of right wrist, hand and finger(s), initial encounter: Secondary | ICD-10-CM | POA: Diagnosis present

## 2015-11-15 DIAGNOSIS — Y9389 Activity, other specified: Secondary | ICD-10-CM | POA: Diagnosis not present

## 2015-11-15 NOTE — Discharge Instructions (Signed)
Forearm Fracture °A forearm fracture is a break in one or both of the bones of your arm that are between the elbow and the wrist. Your forearm is made up of two bones called the radius and the ulna. °Some forearm fractures will require surgery. °HOME CARE °If You Have a Cast: °· Do not stick anything inside the cast to scratch your skin. °· Check the skin around the cast every day. Tell your doctor about any concerns. You may put lotion on dry skin around the edges of the cast, but not on the skin underneath the cast. °If You Have a Splint: °· Wear it as told by your doctor. Remove it only as told by your doctor. °· Loosen the splint if your fingers become numb and tingle, or if they turn cold and blue. °Bathing °· Cover the cast or splint with a watertight plastic bag to protect it from water while you take a bath or a shower. Do not let the cast or splint get wet. °Managing Pain, Stiffness, and Swelling °· If told, apply ice to the injured area: °¨ Put ice in a plastic bag. °¨ Place a towel between your skin and the bag. °¨ Leave the ice on for 20 minutes, 2-3 times a day. °· Move your fingers often to avoid stiffness and to lessen swelling. °· Raise the injured area above the level of your heart while you are sitting or lying down. °Driving °· Do not drive or use heavy machinery while taking pain medicine. °· Do not drive while wearing a cast or splint on a hand that you use for driving. °Activity °· Return to your normal activities as told by your doctor. Ask your doctor what activities are safe for you. °· Do range-of-motion exercises only as told by your doctor. °Safety °· Do not use your injured limb to support your body weight until your doctor says that you can. °General Instructions °· Do not put pressure on any part of the cast or splint until it is fully hardened. This may take several hours. °· Keep the cast or splint clean and dry. °· Do not use any tobacco products, including cigarettes, chewing  tobacco, or electronic cigarettes. Tobacco can delay bone healing. If you need help quitting, ask your doctor. °· Take medicines only as told by your doctor. °· Keep all follow-up visits as told by your doctor. This is important. °GET HELP IF: °· Your pain medicine is not helping. °· Your cast breaks or gets damaged. °· Your cast gets loose. °· Your cast feels too tight. °· Your cast gets wet. °· You have more severe pain or swelling than you did before the cast. °· You have severe pain when you stretch your fingers. °· You continue to have pain or stiffness in your elbow or your wrist after your cast is taken off. °GET HELP RIGHT AWAY IF:  °· You cannot move your fingers. °· You lose feeling in your fingers or your hand. °· Your hand or your fingers turn cold and pale or blue. °· You notice a bad smell coming from your cast. °· You have fluid or drainage from underneath your cast. °· You have new stains from blood, fluid, or drainage that is coming through your cast. °  °This information is not intended to replace advice given to you by your health care provider. Make sure you discuss any questions you have with your health care provider. °  °Document Released: 03/10/2008 Document Revised: 10/13/2014 Document   Reviewed: 05/08/2014 Elsevier Interactive Patient Education [employer].

## 2015-11-15 NOTE — ED Provider Notes (Signed)
[facility] Emergency Department Provider Note  ____________________________________________  Time seen: Approximately 1:20 PM  I have reviewed the triage vital signs and the nursing notes.   HISTORY  Chief Complaint Arm Pain    HPI Katrina Jefferson is a 82 y.o. female , NAD, presents to the emergency department with right wrist pain that started after she fell to [place] work on Tuesday. States she placed her hand out in front of her brace her fall. Has been able to move the wrist, hand, fingers but has noted increased bruising and swelling over the last day. Denies numbness, weakness, tingling. Denies head injury, headache, LOC.   Past Medical History  Diagnosis Date  . COPD (chronic obstructive pulmonary disease) ([place])   . Arthritis   . Depression   . Diabetes mellitus without complication ([place])   . Hyperlipidemia   . Insomnia   . Adrenal mass, left ([place])   . Osteoporosis   . Retinal hemorrhage     There are no active problems to display for this patient.   Past Surgical History  Procedure Laterality Date  . Esophagogastroduodenoscopy (egd) with propofol N/A 09/17/2015    Procedure: ESOPHAGOGASTRODUODENOSCOPY (EGD) WITH PROPOFOL;  Surgeon: Josefine Class, MD;  [facility] END[facility]COPY;  Service: Endoscopy;  Laterality: N/A;    Current Outpatient Rx  Name  Route  Sig  Dispense  Refill  . acetaminophen (TYLENOL) 650 MG CR tablet   Oral   Take 1,300 mg by mouth 2 (two) times daily.         . clobetasol cream (TEMOVATE) 0.05 %   Topical   Apply 1 application topically 2 (two) times daily.         Marland Kitchen escitalopram (LEXAPRO) 10 MG tablet   Oral   Take 10 mg by mouth daily.         . fluticasone (FLONASE) 50 MCG/ACT nasal spray   Each Nare   Place into both nostrils daily.         Marland Kitchen HYDROcodone-acetaminophen (NORCO/VICODIN) 5-325 MG tablet   Oral   Take 1 tablet by mouth 2 (two) times daily.         . Loratadine (CLARITIN)  10 MG CAPS   Oral   Take 10 mg by mouth daily.         . metFORMIN (GLUCOPHAGE) 500 MG tablet   Oral   Take by mouth 2 (two) times daily with a meal.         . omeprazole (PRILOSEC) 40 MG capsule   Oral   Take 40 mg by mouth daily.         . promethazine (PHENERGAN) 25 MG tablet   Oral   Take 25 mg by mouth at bedtime as needed for nausea or vomiting.         . ranitidine (ZANTAC) 75 MG tablet   Oral   Take 75 mg by mouth 2 (two) times daily.         . simvastatin (ZOCOR) 40 MG tablet   Oral   Take 40 mg by mouth daily.         . temazepam (RESTORIL) 15 MG capsule   Oral   Take 15 mg by mouth at bedtime as needed for sleep.           Allergies Codeine sulfate; Erythromycin; Sulfa antibiotics; and Ultracet  No family history on file.  Social History Social History  Substance Use Topics  . Smoking status: Current Every  Day Smoker -- 0.50 packs/day  . Smokeless tobacco: Never Used  . Alcohol Use: No     Review of Systems Constitutional: No fever/chills Eyes: No visual changes.  Cardiovascular: No chest pain. Respiratory: No shortness of breath. Musculoskeletal: Positive for right wrist pain. Negative for back pain.  Skin: Positive for bruising and swelling about the right distal forearm, wrist, hand, fingers. Negative for rash nor open wounds. Neurological: Negative for headaches, focal weakness or numbness. 10-point ROS otherwise negative.  ____________________________________________   PHYSICAL EXAM:  VITAL SIGNS: ED Triage Vitals  Enc Vitals Group     BP 11/15/15 1157 156/72 mmHg     Pulse Rate 11/15/15 1157 86     Resp 11/15/15 1157 16     Temp 11/15/15 1157 97.7 F (36.5 C)     Temp Source 11/15/15 1157 Oral     SpO2 11/15/15 1157 96 %     Weight 11/15/15 1157 140 lb (63.504 kg)     Height 11/15/15 1157 5' 2.5" (1.588 m)     Head Cir --      Peak Flow --      Pain Score 11/15/15 1158 6     Pain Loc --      Pain Edu? --       Excl. in [place]? --     Constitutional: Alert and oriented. Well appearing and in no acute distress. Eyes: Conjunctivae are normal. PERRL. EOMI without pain.  Head: Atraumatic. Cardiovascular: Normal rate, regular rhythm. Normal S1 and S2.  Good peripheral circulation about bilateral upper extremities noting pulses to be 2+. Respiratory: Normal respiratory effort without tachypnea or retractions. Lungs CTAB. Musculoskeletal: Positive for ear pain over the distal radius. No crepitus on exam. Full range of motion of right wrist, hand, fingers. Full range of motion of the right elbow without tenderness to palpation.  Neurologic:  Normal speech and language. No gross focal neurologic deficits are appreciated. Sensation without abnormality of bilateral upper extremities. Skin:  Skin about the distal right forearm, wrist, hand, fingers moderately swollen with blue ecchymosis present. No open wounds, lacerations.  Psychiatric: Mood and affect are normal. Speech and behavior are normal. Patient exhibits appropriate insight and judgement.   ____________________________________________   LABS  None  ____________________________________________  EKG  None ____________________________________________  RADIOLOGY I have personally viewed and evaluated these images (plain radiographs) as part of my medical decision making, as well as reviewing the written report by the radiologist.  Dg Wrist Complete Right  11/15/2015  CLINICAL DATA:  Pain following fall 2 days prior EXAM: RIGHT WRIST - COMPLETE 3+ VIEW COMPARISON:  None. FINDINGS: Frontal, oblique, lateral, and ulnar deviation scaphoid images were obtained. There is soft tissue swelling in a generalized manner. Bones are somewhat osteoporotic. There is a subtle transverse lucency in the distal radial metaphysis, best seen on the ulnar deviation scaphoid image. In this region on the lateral view, there is questionable equivocal cortical disruption  dorsally. This finding is concerning for a nondisplaced fracture of the distal radial metaphysis. There is no other evidence of potential fracture. No dislocation. There is advanced osteoarthritic change in the first carpal -metacarpal joint. There is moderate osteoarthritic change in the scaphotrapezial joint. There is calcification in the triangular fibrocartilage. IMPRESSION: Subtle linear lucency in the distal radial metaphysis with underlying osteoporosis. A nondisplaced fracture of the distal radius is of concern given this appearance. No other evidence of potential fracture. Advanced osteoarthritis in the first carpal -metacarpal joint. Calcification in the triangular  fibrocartilage is concerning for potential chronic triangular fibrocartilage tear. There is generalized soft tissue swelling. Electronically Signed   By: Lowella Grip III M.D.   On: 11/15/2015 13:15    ____________________________________________    PROCEDURES  Procedure(s) performed: None    Medications - No data to display   ____________________________________________   INITIAL IMPRESSION / ASSESSMENT AND PLAN / ED COURSE  Pertinent imaging results that were available during my care of the patient were reviewed by me and considered in my medical decision making (see chart for details).  Patient's diagnosis is consistent with distal right radius fracture.  Sugar tong splint applied to the distal right upper extremity. Patient tolerated well without any increasing pain. She was also placed in an arm sling for comfort. May take Tylenol as needed for pain. Patient is to follow up with Dr. Mack Guise in orthopedics tomorrow.  Patient is given ED precautions to return to the ED for any worsening or new symptoms.    ____________________________________________  FINAL CLINICAL IMPRESSION(S) / ED DIAGNOSES  Final diagnoses:  Fracture of right radius, closed, initial encounter  Fall, initial encounter      NEW  MEDICATIONS STARTED DURING THIS VISIT:  Discharge Medication List as of 11/15/2015  1:30 PM           Braxton Feathers, PA-C 11/15/15 Whiteside Gayle, MD 11/15/15 1549

## 2015-11-15 NOTE — ED Notes (Signed)
Patient presents to the ED with right wrist pain after falling doing yard work on Tuesday.  Patient is able to move hand but wrist appears swollen and bruised.  Patient is alert and oriented, no obvious distress.  Denies any other injury.

## 2015-11-27 ENCOUNTER — Observation Stay
Admission: EM | Admit: 2015-11-27 | Discharge: 2015-11-30 | Disposition: A | Discharge status: Skilled Nursing Facility | Payer: Medicare Other | Attending: Internal Medicine | Admitting: Internal Medicine

## 2015-11-27 ENCOUNTER — Emergency Department: Payer: Medicare Other

## 2015-11-27 DIAGNOSIS — M160 Bilateral primary osteoarthritis of hip: Secondary | ICD-10-CM | POA: Diagnosis not present

## 2015-11-27 DIAGNOSIS — Z9889 Other specified postprocedural states: Secondary | ICD-10-CM | POA: Insufficient documentation

## 2015-11-27 DIAGNOSIS — M25561 Pain in right knee: Principal | ICD-10-CM | POA: Insufficient documentation

## 2015-11-27 DIAGNOSIS — Z886 Allergy status to analgesic agent status: Secondary | ICD-10-CM | POA: Diagnosis not present

## 2015-11-27 DIAGNOSIS — R42 Dizziness and giddiness: Secondary | ICD-10-CM | POA: Insufficient documentation

## 2015-11-27 DIAGNOSIS — E119 Type 2 diabetes mellitus without complications: Secondary | ICD-10-CM | POA: Diagnosis not present

## 2015-11-27 DIAGNOSIS — Z882 Allergy status to sulfonamides status: Secondary | ICD-10-CM | POA: Diagnosis not present

## 2015-11-27 DIAGNOSIS — M4690 Unspecified inflammatory spondylopathy, site unspecified: Secondary | ICD-10-CM | POA: Diagnosis not present

## 2015-11-27 DIAGNOSIS — Z833 Family history of diabetes mellitus: Secondary | ICD-10-CM | POA: Insufficient documentation

## 2015-11-27 DIAGNOSIS — Z885 Allergy status to narcotic agent status: Secondary | ICD-10-CM | POA: Diagnosis not present

## 2015-11-27 DIAGNOSIS — M545 Low back pain: Secondary | ICD-10-CM | POA: Insufficient documentation

## 2015-11-27 DIAGNOSIS — J449 Chronic obstructive pulmonary disease, unspecified: Secondary | ICD-10-CM | POA: Diagnosis not present

## 2015-11-27 DIAGNOSIS — Z7984 Long term (current) use of oral hypoglycemic drugs: Secondary | ICD-10-CM | POA: Diagnosis not present

## 2015-11-27 DIAGNOSIS — F329 Major depressive disorder, single episode, unspecified: Secondary | ICD-10-CM | POA: Diagnosis not present

## 2015-11-27 DIAGNOSIS — R0789 Other chest pain: Secondary | ICD-10-CM | POA: Insufficient documentation

## 2015-11-27 DIAGNOSIS — S29009A Unspecified injury of muscle and tendon of unspecified wall of thorax, initial encounter: Secondary | ICD-10-CM | POA: Insufficient documentation

## 2015-11-27 DIAGNOSIS — M25551 Pain in right hip: Secondary | ICD-10-CM | POA: Diagnosis present

## 2015-11-27 DIAGNOSIS — Z881 Allergy status to other antibiotic agents status: Secondary | ICD-10-CM | POA: Diagnosis not present

## 2015-11-27 DIAGNOSIS — E278 Other specified disorders of adrenal gland: Secondary | ICD-10-CM | POA: Diagnosis not present

## 2015-11-27 DIAGNOSIS — M1711 Unilateral primary osteoarthritis, right knee: Secondary | ICD-10-CM | POA: Insufficient documentation

## 2015-11-27 DIAGNOSIS — S0990XA Unspecified injury of head, initial encounter: Secondary | ICD-10-CM | POA: Insufficient documentation

## 2015-11-27 DIAGNOSIS — I10 Essential (primary) hypertension: Secondary | ICD-10-CM | POA: Diagnosis present

## 2015-11-27 DIAGNOSIS — Z818 Family history of other mental and behavioral disorders: Secondary | ICD-10-CM | POA: Diagnosis not present

## 2015-11-27 DIAGNOSIS — E785 Hyperlipidemia, unspecified: Secondary | ICD-10-CM | POA: Diagnosis present

## 2015-11-27 DIAGNOSIS — Z66 Do not resuscitate: Secondary | ICD-10-CM | POA: Diagnosis not present

## 2015-11-27 DIAGNOSIS — Z79899 Other long term (current) drug therapy: Secondary | ICD-10-CM | POA: Insufficient documentation

## 2015-11-27 DIAGNOSIS — S29019A Strain of muscle and tendon of unspecified wall of thorax, initial encounter: Secondary | ICD-10-CM

## 2015-11-27 DIAGNOSIS — F17200 Nicotine dependence, unspecified, uncomplicated: Secondary | ICD-10-CM | POA: Diagnosis not present

## 2015-11-27 DIAGNOSIS — M810 Age-related osteoporosis without current pathological fracture: Secondary | ICD-10-CM | POA: Diagnosis not present

## 2015-11-27 DIAGNOSIS — S39012A Strain of muscle, fascia and tendon of lower back, initial encounter: Secondary | ICD-10-CM | POA: Insufficient documentation

## 2015-11-27 DIAGNOSIS — I639 Cerebral infarction, unspecified: Secondary | ICD-10-CM | POA: Diagnosis not present

## 2015-11-27 DIAGNOSIS — R296 Repeated falls: Secondary | ICD-10-CM | POA: Diagnosis not present

## 2015-11-27 DIAGNOSIS — Z7951 Long term (current) use of inhaled steroids: Secondary | ICD-10-CM | POA: Insufficient documentation

## 2015-11-27 DIAGNOSIS — W19XXXA Unspecified fall, initial encounter: Secondary | ICD-10-CM | POA: Diagnosis not present

## 2015-11-27 DIAGNOSIS — R55 Syncope and collapse: Secondary | ICD-10-CM | POA: Diagnosis not present

## 2015-11-27 DIAGNOSIS — I6523 Occlusion and stenosis of bilateral carotid arteries: Secondary | ICD-10-CM | POA: Insufficient documentation

## 2015-11-27 DIAGNOSIS — Z8249 Family history of ischemic heart disease and other diseases of the circulatory system: Secondary | ICD-10-CM | POA: Insufficient documentation

## 2015-11-27 MED ORDER — ACETAMINOPHEN 325 MG PO TABS
ORAL_TABLET | ORAL | Status: AC
Start: 2015-11-27 — End: 2015-11-27
  Administered 2015-11-27: 975 mg via ORAL
  Filled 2015-11-27: qty 3

## 2015-11-27 MED ORDER — ACETAMINOPHEN 325 MG PO TABS
975.0000 mg | ORAL_TABLET | Freq: Once | ORAL | Status: AC
  Administered 2015-11-27: 975 mg via ORAL

## 2015-11-27 NOTE — ED Notes (Signed)
Pt arrived via EMS c/o Fall. Per EMS pt fell in her kitchen, hitting her head abstaining a hematoma to the lower part of her skull, as well as back pain. Pt describes legs giving away denies tripping over anything and believes she hit her head against the stove but unsure if it hit the ground.

## 2015-11-27 NOTE — ED Notes (Signed)
MD at bedside. 

## 2015-11-28 ENCOUNTER — Encounter: Payer: Self-pay | Admitting: Internal Medicine

## 2015-11-28 ENCOUNTER — Observation Stay: Payer: Medicare Other

## 2015-11-28 ENCOUNTER — Emergency Department: Payer: Medicare Other

## 2015-11-28 ENCOUNTER — Observation Stay
Admit: 2015-11-28 | Discharge: 2015-11-28 | Disposition: A | Discharge status: Home / Self Care | Payer: Medicare Other | Attending: Internal Medicine | Admitting: Internal Medicine

## 2015-11-28 DIAGNOSIS — J449 Chronic obstructive pulmonary disease, unspecified: Secondary | ICD-10-CM | POA: Diagnosis present

## 2015-11-28 DIAGNOSIS — E785 Hyperlipidemia, unspecified: Secondary | ICD-10-CM | POA: Diagnosis present

## 2015-11-28 DIAGNOSIS — W19XXXA Unspecified fall, initial encounter: Secondary | ICD-10-CM | POA: Diagnosis present

## 2015-11-28 DIAGNOSIS — R296 Repeated falls: Secondary | ICD-10-CM

## 2015-11-28 DIAGNOSIS — I10 Essential (primary) hypertension: Secondary | ICD-10-CM | POA: Diagnosis present

## 2015-11-28 LAB — URINALYSIS COMPLETE WITH MICROSCOPIC (ARMC ONLY)
BACTERIA UA: NONE SEEN
GLUCOSE, UA: 150 mg/dL — AB
HGB URINE DIPSTICK: NEGATIVE
Ketones, ur: NEGATIVE mg/dL
Leukocytes, UA: NEGATIVE
Nitrite: NEGATIVE
PH: 5 (ref 5.0–8.0)
Protein, ur: 100 mg/dL — AB
Specific Gravity, Urine: 1.025 (ref 1.005–1.030)

## 2015-11-28 LAB — CBC
HEMATOCRIT: 37.5 % (ref 35.0–47.0)
Hemoglobin: 12.4 g/dL (ref 12.0–16.0)
MCH: 29.8 pg (ref 26.0–34.0)
MCHC: 33 g/dL (ref 32.0–36.0)
MCV: 90.3 fL (ref 80.0–100.0)
PLATELETS: 227 10*3/uL (ref 150–440)
RBC: 4.15 MIL/uL (ref 3.80–5.20)
RDW: 14.3 % (ref 11.5–14.5)
WBC: 5.1 10*3/uL (ref 3.6–11.0)

## 2015-11-28 LAB — GLUCOSE, CAPILLARY
GLUCOSE-CAPILLARY: 172 mg/dL — AB (ref 65–99)
GLUCOSE-CAPILLARY: 162 mg/dL — AB (ref 65–99)
Glucose-Capillary: 134 mg/dL — ABNORMAL HIGH (ref 65–99)
Glucose-Capillary: 123 mg/dL — ABNORMAL HIGH (ref 65–99)

## 2015-11-28 LAB — BASIC METABOLIC PANEL
ANION GAP: 7 (ref 5–15)
BUN: 20 mg/dL (ref 6–20)
CALCIUM: 9.2 mg/dL (ref 8.9–10.3)
CO2: 25 mmol/L (ref 22–32)
CREATININE: 0.45 mg/dL (ref 0.44–1.00)
Chloride: 106 mmol/L (ref 101–111)
Glucose, Bld: 184 mg/dL — ABNORMAL HIGH (ref 65–99)
Potassium: 3.6 mmol/L (ref 3.5–5.1)
SODIUM: 138 mmol/L (ref 135–145)

## 2015-11-28 LAB — TROPONIN I: Troponin I: 0.03 ng/mL (ref <0.031)

## 2015-11-28 MED ORDER — OXYCODONE HCL 5 MG PO TABS
5.0000 mg | ORAL_TABLET | ORAL | Status: DC | PRN
  Administered 2015-11-28 (×3): 5 mg via ORAL
  Filled 2015-11-28 (×2): qty 1

## 2015-11-28 MED ORDER — SIMVASTATIN 40 MG PO TABS
40.0000 mg | ORAL_TABLET | Freq: Every day | ORAL | Status: DC
  Administered 2015-11-28 – 2015-11-30 (×3): 40 mg via ORAL
  Filled 2015-11-28 (×3): qty 1

## 2015-11-28 MED ORDER — PANTOPRAZOLE SODIUM 40 MG PO TBEC
40.0000 mg | DELAYED_RELEASE_TABLET | Freq: Every day | ORAL | Status: DC
  Administered 2015-11-28 – 2015-11-30 (×3): 40 mg via ORAL
  Filled 2015-11-28 (×3): qty 1

## 2015-11-28 MED ORDER — INSULIN ASPART 100 UNIT/ML ~~LOC~~ SOLN
0.0000 [IU] | Freq: Three times a day (TID) | SUBCUTANEOUS | Status: DC
  Administered 2015-11-28: 2 [IU] via SUBCUTANEOUS
  Administered 2015-11-28: 1 [IU] via SUBCUTANEOUS
  Administered 2015-11-29 (×2): 2 [IU] via SUBCUTANEOUS
  Administered 2015-11-29: 1 [IU] via SUBCUTANEOUS
  Administered 2015-11-30: 2 [IU] via SUBCUTANEOUS
  Administered 2015-11-30: 3 [IU] via SUBCUTANEOUS
  Filled 2015-11-28: qty 1
  Filled 2015-11-28: qty 3
  Filled 2015-11-28 (×3): qty 2

## 2015-11-28 MED ORDER — ESCITALOPRAM OXALATE 10 MG PO TABS
10.0000 mg | ORAL_TABLET | Freq: Every day | ORAL | Status: DC
  Administered 2015-11-28 – 2015-11-30 (×3): 10 mg via ORAL
  Filled 2015-11-28 (×3): qty 1

## 2015-11-28 MED ORDER — SODIUM CHLORIDE 0.9% FLUSH
3.0000 mL | Freq: Two times a day (BID) | INTRAVENOUS | Status: DC
  Administered 2015-11-28 – 2015-11-30 (×5): 3 mL via INTRAVENOUS

## 2015-11-28 MED ORDER — OXYCODONE HCL 5 MG PO TABS
ORAL_TABLET | ORAL | Status: AC
Start: 2015-11-28 — End: 2015-11-28
  Administered 2015-11-28: 5 mg via ORAL
  Filled 2015-11-28: qty 1

## 2015-11-28 MED ORDER — SODIUM CHLORIDE 0.9 % IV SOLN
INTRAVENOUS | Status: DC
  Administered 2015-11-28: 08:00:00 via INTRAVENOUS

## 2015-11-28 MED ORDER — HYDRALAZINE HCL 20 MG/ML IJ SOLN
20.0000 mg | Freq: Once | INTRAMUSCULAR | Status: AC
  Administered 2015-11-28: 20 mg via INTRAVENOUS

## 2015-11-28 MED ORDER — ACETAMINOPHEN 650 MG RE SUPP: 650.0000 mg | Freq: Four times a day (QID) | RECTAL | Status: DC | PRN

## 2015-11-28 MED ORDER — ONDANSETRON HCL 4 MG PO TABS: 4.0000 mg | ORAL_TABLET | Freq: Four times a day (QID) | ORAL | Status: DC | PRN

## 2015-11-28 MED ORDER — ONDANSETRON HCL 4 MG/2ML IJ SOLN
INTRAMUSCULAR | Status: AC
  Administered 2015-11-29: 4 mg via INTRAVENOUS
  Filled 2015-11-28: qty 2

## 2015-11-28 MED ORDER — INSULIN ASPART 100 UNIT/ML ~~LOC~~ SOLN
0.0000 [IU] | Freq: Every day | SUBCUTANEOUS | Status: DC
  Administered 2015-11-29: 2 [IU] via SUBCUTANEOUS
  Filled 2015-11-28: qty 2

## 2015-11-28 MED ORDER — HYDRALAZINE HCL 25 MG PO TABS
25.0000 mg | ORAL_TABLET | Freq: Three times a day (TID) | ORAL | Status: DC
  Administered 2015-11-28 – 2015-11-30 (×6): 25 mg via ORAL
  Filled 2015-11-28 (×7): qty 1

## 2015-11-28 MED ORDER — ENOXAPARIN SODIUM 40 MG/0.4ML ~~LOC~~ SOLN
40.0000 mg | SUBCUTANEOUS | Status: DC
  Administered 2015-11-28 – 2015-11-30 (×3): 40 mg via SUBCUTANEOUS
  Filled 2015-11-28 (×3): qty 0.4

## 2015-11-28 MED ORDER — ACETAMINOPHEN 325 MG PO TABS
650.0000 mg | ORAL_TABLET | Freq: Four times a day (QID) | ORAL | Status: DC | PRN
  Administered 2015-11-29 – 2015-11-30 (×3): 650 mg via ORAL
  Filled 2015-11-28 (×2): qty 2

## 2015-11-28 MED ORDER — ONDANSETRON HCL 4 MG/2ML IJ SOLN
4.0000 mg | Freq: Four times a day (QID) | INTRAMUSCULAR | Status: DC | PRN
  Administered 2015-11-28 – 2015-11-29 (×2): 4 mg via INTRAVENOUS
  Filled 2015-11-28: qty 2

## 2015-11-28 MED ORDER — HYDRALAZINE HCL 20 MG/ML IJ SOLN
10.0000 mg | INTRAMUSCULAR | Status: DC | PRN
  Administered 2015-11-28 – 2015-11-30 (×3): 10 mg via INTRAVENOUS
  Filled 2015-11-28 (×3): qty 1

## 2015-11-28 MED ORDER — IOHEXOL 350 MG/ML SOLN
100.0000 mL | Freq: Once | INTRAVENOUS | Status: AC | PRN
  Administered 2015-11-28: 100 mL via INTRAVENOUS

## 2015-11-28 MED ORDER — ASPIRIN 81 MG PO CHEW
81.0000 mg | CHEWABLE_TABLET | Freq: Every day | ORAL | Status: DC
  Administered 2015-11-28 – 2015-11-30 (×3): 81 mg via ORAL
  Filled 2015-11-28 (×2): qty 1

## 2015-11-28 MED ORDER — HYDRALAZINE HCL 20 MG/ML IJ SOLN
INTRAMUSCULAR | Status: AC
  Administered 2015-11-28: 20 mg via INTRAVENOUS
  Filled 2015-11-28: qty 1

## 2015-11-28 NOTE — Care Management Obs Status (Signed)
[facility] NOTIFICATION   Patient Details  Name: Katrina Jefferson MRN: [id number] Date of Birth: 12-Jul-1924   Medicare Observation Status Notification Given:  Yes,     Delrae Sawyers, RN 11/28/2015, 7:27 AM

## 2015-11-28 NOTE — ED Provider Notes (Signed)
-----------------------------------------   1:14 AM on 11/28/2015 -----------------------------------------  Updated patient under daughter of laboratory results. Patient reports that she has had multiple falls recently and does not know why she is falling. Will check urinalysis as well as attempt to ambulate patient with walker.  ----------------------------------------- 3:22 AM on 11/28/2015 -----------------------------------------  Patient was helped to the commode with heavy assistance and complained of extreme pain to lower back. She had the following CT scans:  ED lumbar spine without contrast interpreted per Dr. Radene Knee: 1. No evidence of acute fracture or subluxation along the lumbar spine. 2. Mild degenerative change along the lumbar spine, as described above. Multilevel foraminal narrowing along the lower lumbar spine, but no definite evidence of impression on exiting nerve roots. 3. Scattered calcification along the abdominal aorta and its branches. 4. Nonobstructing 8 mm stone at the lower pole of the left kidney. 5. Scattered diverticulosis along the visualized sigmoid colon. 6. Minimal bibasilar airspace opacities may reflect atelectasis or possibly mild infection.  CT right hip without contrast interpreted per Dr. Marisue Humble: No fracture or subluxation of the right hip.  At discussed the case with hospitalist Dr. Jannifer [facility] who will evaluate patient in the emergency department for admission.  Paulette Blanch, MD 11/28/15 [phone]

## 2015-11-28 NOTE — ED Notes (Signed)
Patient ambulated with assistance by this nurse and Mayra, EDT per MD request.  Pt c/o extreme pain to mid back with movement.

## 2015-11-28 NOTE — Progress Notes (Signed)
[facility] NAME: Katrina Jefferson    MR#:  [id number]  DATE OF BIRTH:  05-14-1924  SUBJECTIVE: Admitted for fall due to dizziness. Complaints of back pain and also right hand pain.   CHIEF COMPLAINT:   Chief Complaint  Patient presents with  . Fall    REVIEW OF SYSTEMS:    Review of Systems  Constitutional: Negative for fever and chills.  HENT: Negative for hearing loss.   Eyes: Negative for blurred vision, double vision and photophobia.  Respiratory: Negative for cough, hemoptysis and shortness of breath.   Cardiovascular: Negative for palpitations, orthopnea and leg swelling.  Gastrointestinal: Negative for vomiting, abdominal pain and diarrhea.  Genitourinary: Negative for dysuria and urgency.  Musculoskeletal: Positive for back pain, joint pain and falls. Negative for myalgias and neck pain.  Skin: Negative for rash.  Neurological: Positive for dizziness. Negative for focal weakness, seizures, weakness and headaches.  Psychiatric/Behavioral: Negative for memory loss. The patient does not have insomnia.     Nutrition:  Tolerating Diet: Tolerating PT:      DRUG ALLERGIES:   Allergies  Allergen Reactions  . Codeine Sulfate   . Erythromycin   . Sulfa Antibiotics   . Ultracet [Tramadol-Acetaminophen]     VITALS:  Blood pressure 157/50, pulse 97, temperature 97.7 F (36.5 C), temperature source Oral, resp. rate 20, height 5\' 3"  (1.6 m), weight 65.908 kg (145 lb 4.8 oz), SpO2 95 %.  PHYSICAL EXAMINATION:   Physical Exam  GENERAL:  82 y.o.-year-old patient lying in the bed with no acute distress.  EYES: Pupils equal, round, reactive to light and accommodation. No scleral icterus. Extraocular muscles intact.  HEENT: Head atraumatic, normocephalic. Oropharynx and nasopharynx clear.  NECK:  Supple, no jugular venous distention. No thyroid enlargement, no tenderness.  LUNGS: Normal breath sounds  bilaterally, no wheezing, rales,rhonchi or crepitation. No use of accessory muscles of respiration.  CARDIOVASCULAR: S1, S2 normal. No murmurs, rubs, or gallops.  ABDOMEN: Soft, nontender, nondistended. Bowel sounds present. No organomegaly or mass.  EXTREMITIES: No pedal edema, cyanosis, or clubbing.  NEUROLOGIC: Cranial nerves II through XII are intact. Muscle strength 5/5 in all extremities. Sensation intact. Gait not checked.  PSYCHIATRIC: The patient is alert and oriented x 3.  SKIN: No obvious rash, lesion, or ulcer.    LABORATORY PANEL:   CBC  Recent Labs Lab 11/27/15 2233  WBC 5.1  HGB 12.4  HCT 37.5  PLT 227   ------------------------------------------------------------------------------------------------------------------  Chemistries   Recent Labs Lab 11/27/15 2233  NA 138  K 3.6  CL 106  CO2 25  GLUCOSE 184*  BUN 20  CREATININE 0.45  CALCIUM 9.2   ------------------------------------------------------------------------------------------------------------------  Cardiac Enzymes  Recent Labs Lab 11/27/15 2233  TROPONINI 0.03   ------------------------------------------------------------------------------------------------------------------  RADIOLOGY:  Dg Thoracic Spine 2 View  11/28/2015  CLINICAL DATA:  Status post fall in kitchen, with upper back pain. Initial encounter. EXAM: THORACIC SPINE 2 VIEWS COMPARISON:  CT of the chest performed 09/18/2015 FINDINGS: There is no evidence of fracture or subluxation along the thoracic spine. Thoracic vertebral bodies demonstrate normal height and alignment. Intervertebral disc spaces are preserved. There is grade 2 anterolisthesis of C4 on C5, and grade 1 anterolisthesis of C5 on C6, reflecting underlying facet disease. The visualized portions of both lungs are clear. The mediastinum is unremarkable in appearance. IMPRESSION: 1. No evidence of fracture or subluxation along the thoracic spine. 2. Mild degenerative  change along the cervical  spine, as described above. Electronically Signed   By: Garald Balding M.D.   On: 11/28/2015 00:04   Dg Lumbar Spine Complete  11/28/2015  CLINICAL DATA:  Status post fall in kitchen, with lower back pain. Initial encounter. EXAM: LUMBAR SPINE - COMPLETE 4+ VIEW COMPARISON:  CT of the abdomen and pelvis performed 09/18/2015 FINDINGS: There is no evidence of acute fracture or subluxation. Vertebral bodies demonstrate normal height. There is mild grade 1 retrolisthesis of L1 on L2 and of L2 on L3, with mild multilevel disc space narrowing along the lumbar spine, and underlying facet disease. The visualized bowel gas pattern is unremarkable in appearance; air and stool are noted within the colon. The sacroiliac joints are within normal limits. IMPRESSION: 1. No evidence of fracture or subluxation along the lumbar spine. 2. Mild diffuse degenerative change along the lumbar spine. Electronically Signed   By: Garald Balding M.D.   On: 11/28/2015 00:08   Ct Head Wo Contrast  11/27/2015  CLINICAL DATA:  Fall in kitchen sustaining head injury. Posterior hematoma after striking head. EXAM: CT HEAD WITHOUT CONTRAST TECHNIQUE: Contiguous axial images were obtained from the base of the skull through the vertex without intravenous contrast. COMPARISON:  None. FINDINGS: No intracranial hemorrhage, mass effect, or midline shift. Age-related atrophy and chronic small vessel ischemia. Small lacunar infarct in the periventricular right frontal lobe. No hydrocephalus. The basilar cisterns are patent. No evidence of territorial infarct. No intracranial fluid collection. Calvarium is intact. Included paranasal sinuses and mastoid air cells are well aerated. IMPRESSION: 1.  No acute intracranial abnormality. 2. Age related atrophy and chronic small vessel ischemia. Electronically Signed   By: Jeb Levering M.D.   On: 11/27/2015 23:27   Ct Lumbar Spine Wo Contrast  11/28/2015  CLINICAL DATA:  Status  post fall onto ground, with lower back pain. Initial encounter. EXAM: CT LUMBAR SPINE WITHOUT CONTRAST TECHNIQUE: Multidetector CT imaging of the lumbar spine was performed without intravenous contrast administration. Multiplanar CT image reconstructions were also generated. COMPARISON:  Lumbar spine radiographs performed 11/27/2015, and CT of the abdomen and pelvis performed 05/17/2014 FINDINGS: There is no evidence of acute fracture or subluxation along the lumbar spine. There is mild grade 1 retrolisthesis of L1 on L2. Multilevel vacuum phenomenon is noted along the lumbar spine. Scattered anterior and posterior osteophytes are noted along the lumbar spine. Underlying facet disease is noted. Broad-based disc protrusions are noted L1-L2, L3-L4, L4-L5 and L5-S1, with mild associated foraminal narrowing at the lower lumbar spine but no definite evidence of impression on exiting nerve roots. Scattered calcification is noted along the abdominal aorta and its branches. A nonobstructing 8 mm stone is noted at the lower pole of the left kidney. Scattered diverticulosis is noted along the visualized sigmoid colon. There is mild atrophy involving the paraspinal musculature. Minimal bibasilar airspace opacities may reflect atelectasis or possibly mild infection. IMPRESSION: 1. No evidence of acute fracture or subluxation along the lumbar spine. 2. Mild degenerative change along the lumbar spine, as described above. Multilevel foraminal narrowing along the lower lumbar spine, but no definite evidence of impression on exiting nerve roots. 3. Scattered calcification along the abdominal aorta and its branches. 4. Nonobstructing 8 mm stone at the lower pole of the left kidney. 5. Scattered diverticulosis along the visualized sigmoid colon. 6. Minimal bibasilar airspace opacities may reflect atelectasis or possibly mild infection. Electronically Signed   By: Garald Balding M.D.   On: 11/28/2015 02:43   Ct Hip Right Wo  Contrast  11/28/2015  CLINICAL DATA:  Right hip pain after fall in kitchen. EXAM: CT OF THE RIGHT HIP WITHOUT CONTRAST TECHNIQUE: Multidetector CT imaging of the right hip was performed according to the standard protocol. Multiplanar CT image reconstructions were also generated. COMPARISON:  Radiographs 4 hours prior. FINDINGS: No fracture. Femoral head is seated in the acetabulum. Pubic rami are intact. Age related degenerative change and mild chondrocalcinosis. Pubic symphysis is congruent. No confluent soft tissue hematoma. IMPRESSION: No fracture or subluxation of the right hip. Electronically Signed   By: Jeb Levering M.D.   On: 11/28/2015 02:41   US Carotid Bilateral  11/28/2015  CLINICAL DATA:  Fall.  Hypertension. EXAM: BILATERAL CAROTID DUPLEX ULTRASOUND TECHNIQUE: Pearline Cables scale imaging, color Doppler and duplex ultrasound were performed of bilateral carotid and vertebral arteries in the neck. COMPARISON:  CT 11/27/2015.  MRI 11/02/2008. FINDINGS: Criteria: Quantification of carotid stenosis is based on velocity parameters that correlate the residual internal carotid diameter with NASCET-based stenosis levels, using the diameter of the distal internal carotid lumen as the denominator for stenosis measurement. The following velocity measurements were obtained: RIGHT ICA:  174/47 cm/sec CCA:  [id number] cm/sec SYSTOLIC ICA/CCA RATIO:  1.3 DIASTOLIC ICA/CCA RATIO:  3.7 ECA:  175 cm/sec LEFT ICA:  182/41 cm/sec CCA:  [id number] cm/sec SYSTOLIC ICA/CCA RATIO:  1.1 DIASTOLIC ICA/CCA RATIO:  1.8 ECA:  233 cm/sec RIGHT CAROTID ARTERY: Calcified right carotid bifurcation and proximal ICA plaque. Elevated flow velocities. RIGHT VERTEBRAL ARTERY:  Patent antegrade flow. LEFT CAROTID ARTERY: Calcified left carotid bifurcation and proximal ICA plaque. Elevated flow velocities. LEFT VERTEBRAL ARTERY:  Patent antegrade flow. IMPRESSION: 1. Bilateral calcified carotid bifurcation proximal ICA atherosclerotic vascular plaque.  Degree of stenosis in the 50-69% range bilaterally. 2. Vertebral arteries are patent antegrade flow. Electronically Signed   By: Marcello Moores  Register   On: 11/28/2015 10:21   Dg Knee Complete 4 Views Right  11/28/2015  CLINICAL DATA:  Fall in [employer], now with right knee pain. EXAM: RIGHT KNEE - COMPLETE 4+ VIEW COMPARISON:  None. FINDINGS: No fracture or dislocation. Mild medial tibial femoral joint space narrowing. Lateral meniscal chondrocalcinosis. No joint effusion. Quadriceps enthesophyte. IMPRESSION: 1. No acute fracture dislocation. 2. Mild for age osteoarthritis and chondrocalcinosis. Electronically Signed   By: Jeb Levering M.D.   On: 11/28/2015 00:09   Dg Hip Unilat With Pelvis 2-3 Views Right  11/28/2015  CLINICAL DATA:  Fall in [employer], now with right hip pain. EXAM: DG HIP (WITH OR WITHOUT PELVIS) 2-3V RIGHT COMPARISON:  None. FINDINGS: The cortical margins of the bony pelvis are intact. No fracture. Pubic symphysis and sacroiliac joints are congruent. Both femoral heads are well-seated in the respective acetabula. Age-related degenerative change of both hips, left greater than right. Bones under mineralized. IMPRESSION: No fracture or dislocation of the pelvis or right hip. Electronically Signed   By: Jeb Levering M.D.   On: 11/28/2015 00:07     ASSESSMENT AND PLAN:   Principal Problem:   Frequent falls Active Problems:   COPD (chronic obstructive pulmonary disease) (HCC)   HLD (hyperlipidemia)   Accelerated hypertension   Falls   1. Frequent falls secondary to dizziness: Patient workup including showed no fracture in the knee thoracic or lumbar areas. Patient has a right distal radius fracture by previous x-ray of the right wrist on 9 February so she has  Splint. 2. Secondary to bilateral carotid disease, moderate disease: Start on aspirin. Statins. Check CT of the carotids to  evaluate for extent of the disease. #3. Multiple falls physical therapy consult.  Has lots of back pain issues and in U oxycodone for pain control. #4 uncontrolled hypertension: Patient has no evidence of fall high blood pressure at home. Echocardiogram, start her on hydralazine 25 mg 3 times a day.   All the records are reviewed and case discussed with Care Management/Social Workerr. Management plans discussed with the patient, family and they are in agreement.  CODE STATUS: DO NOT RESUSCITATE  TOTAL TIME TAKING CARE OF THIS PATIENT: 51minutes.   POSSIBLE D/C IN 1-2 DAYS, DEPENDING ON CLINICAL CONDITION.   Epifanio Lesches M.D on 11/28/2015 at 11:01 AM  Between 7am to 6pm - Pager - [phone]  After 6pm go to [email or web address] - password [email or web address] [facility]  Office  [phone]  CC: Primary care physician; Glendon Axe, MD

## 2015-11-28 NOTE — H&P (Signed)
[facility] NAME: Katrina Jefferson    MR#:  [id number]  DATE OF BIRTH:  1924-06-30  DATE OF ADMISSION:  11/27/2015  PRIMARY CARE PHYSICIAN: Glendon Axe, MD   REQUESTING/REFERRING PHYSICIAN: Beather Arbour, M.D.  CHIEF COMPLAINT:   Chief Complaint  Patient presents with  . Fall    HISTORY OF PRESENT ILLNESS:  Katrina Jefferson  is a 82 y.o. female who presents with recent frequent falls. Patient is at baseline and fairly active person. Lives at home with her niece, and has been very active in her ADLs. However, she has fallen twice in the last 2 weeks. The first time she injured her right wrist. This last time that she fell today, she states that she has no loss of consciousness, but she did hit her head and was worried that she may have injured her hip. She also cites some small amount of confusion just after the fall. In the ED today her blood pressure is significantly elevated, systolic and times up in the 200s. Patient states she is not on any medication at home for high blood pressure. Workup in the knee is otherwise largely benign. However, given her increasing frequency of falls and her elevated blood pressure, hospitalists were called for further evaluation.  PAST MEDICAL HISTORY:   Past Medical History  Diagnosis Date  . COPD (chronic obstructive pulmonary disease) ([place])   . Arthritis   . Depression   . Diabetes mellitus without complication ([place])   . Hyperlipidemia   . Insomnia   . Adrenal mass, left ([place])   . Osteoporosis   . Retinal hemorrhage     PAST SURGICAL HISTORY:   Past Surgical History  Procedure Laterality Date  . Esophagogastroduodenoscopy (egd) with propofol N/A 09/17/2015    Procedure: ESOPHAGOGASTRODUODENOSCOPY (EGD) WITH PROPOFOL;  Surgeon: Josefine Class, MD;  Location: [facility] ENDOSCOPY;  Service: Endoscopy;  Laterality: N/A;    SOCIAL HISTORY:   Social History  Substance Use Topics  .  Smoking status: Current Every Day Smoker -- 0.50 packs/day  . Smokeless tobacco: Never Used  . Alcohol Use: No    FAMILY HISTORY:   Family History  Problem Relation Age of Onset  . Heart attack    . Diabetes    . Dementia      DRUG ALLERGIES:   Allergies  Allergen Reactions  . Codeine Sulfate   . Erythromycin   . Sulfa Antibiotics   . Ultracet [Tramadol-Acetaminophen]     MEDICATIONS AT HOME:   Prior to Admission medications   Medication Sig Start Date End Date Taking? Authorizing Provider  acetaminophen (TYLENOL) 650 MG CR tablet Take 1,300 mg by mouth 2 (two) times daily.    Historical Provider, MD  clobetasol cream (TEMOVATE) [id number] % Apply 1 application topically 2 (two) times daily.    Historical Provider, MD  escitalopram (LEXAPRO) 10 MG tablet Take 10 mg by mouth daily.    Historical Provider, MD  fluticasone (FLONASE) 50 MCG/ACT nasal spray Place into both nostrils daily.    Historical Provider, MD  HYDROcodone-acetaminophen (NORCO/VICODIN) 5-325 MG tablet Take 1 tablet by mouth 2 (two) times daily.    Historical Provider, MD  Loratadine (CLARITIN) 10 MG CAPS Take 10 mg by mouth daily.    Historical Provider, MD  metFORMIN (GLUCOPHAGE) 500 MG tablet Take by mouth 2 (two) times daily with a meal.    Historical Provider, MD  omeprazole (PRILOSEC) 40 MG capsule Take 40 mg by mouth  daily.    Historical Provider, MD  promethazine (PHENERGAN) 25 MG tablet Take 25 mg by mouth at bedtime as needed for nausea or vomiting.    Historical Provider, MD  ranitidine (ZANTAC) 75 MG tablet Take 75 mg by mouth 2 (two) times daily.    Historical Provider, MD  simvastatin (ZOCOR) 40 MG tablet Take 40 mg by mouth daily.    Historical Provider, MD  temazepam (RESTORIL) 15 MG capsule Take 15 mg by mouth at bedtime as needed for sleep.    Historical Provider, MD    REVIEW OF SYSTEMS:  Review of Systems  Constitutional: Negative for fever, chills, weight loss and malaise/fatigue.  HENT:  Negative for ear pain, hearing loss and tinnitus.   Eyes: Negative for blurred vision, double vision, pain and redness.  Respiratory: Negative for cough, hemoptysis and shortness of breath.   Cardiovascular: Negative for chest pain, palpitations, orthopnea and leg swelling.  Gastrointestinal: Negative for nausea, vomiting, abdominal pain, diarrhea and constipation.  Genitourinary: Negative for dysuria, frequency and hematuria.  Musculoskeletal: Positive for back pain and falls. Negative for joint pain and neck pain.  Skin:       No acne, rash, or lesions  Neurological: Negative for dizziness, tremors, focal weakness and weakness.  Endo/Heme/Allergies: Negative for polydipsia. Does not bruise/bleed easily.  Psychiatric/Behavioral: Negative for depression. The patient is not nervous/anxious and does not have insomnia.      VITAL SIGNS:   Filed Vitals:   11/27/15 2210 11/27/15 2343 11/28/15 0324  BP: 195/85 190/83 197/94  Pulse: 92 76 77  Temp: 97.7 F (36.5 C)    TempSrc: Oral    Resp: 18 18 26   Height: 5\' 5"  (1.651 m)    Weight: 63.504 kg (140 lb)    SpO2: 94% 96% 90%   Wt Readings from Last 3 Encounters:  11/27/15 63.504 kg (140 lb)  11/15/15 63.504 kg (140 lb)  09/18/15 63.05 kg (139 lb)    PHYSICAL EXAMINATION:  Physical Exam  Vitals reviewed. Constitutional: She is oriented to person, place, and time. She appears well-developed and well-nourished. No distress.  HENT:  Head: Normocephalic and atraumatic.  Mouth/Throat: Oropharynx is clear and moist.  Eyes: Conjunctivae and EOM are normal. Pupils are equal, round, and reactive to light. No scleral icterus.  Neck: Normal range of motion. Neck supple. No JVD present. No thyromegaly present.  Cardiovascular: Normal rate, regular rhythm and intact distal pulses.  Exam reveals no gallop and no friction rub.   No murmur heard. Respiratory: Effort normal and breath sounds normal. No respiratory distress. She has no wheezes. She  has no rales.  GI: Soft. Bowel sounds are normal. She exhibits no distension. There is no tenderness.  Musculoskeletal: Normal range of motion. She exhibits tenderness (back, right hip, right wrist). She exhibits no edema.  No arthritis, no gout  Lymphadenopathy:    She has no cervical adenopathy.  Neurological: She is alert and oriented to person, place, and time. No cranial nerve deficit.  No dysarthria, no aphasia  Skin: Skin is warm and dry. No rash noted. No erythema.  Psychiatric: She has a normal mood and affect. Her behavior is normal. Judgment and thought content normal.    LABORATORY PANEL:   CBC  Recent Labs Lab 11/27/15 2233  WBC 5.1  HGB 12.4  HCT 37.5  PLT 227   ------------------------------------------------------------------------------------------------------------------  Chemistries   Recent Labs Lab 11/27/15 2233  NA 138  K 3.6  CL 106  CO2 25  GLUCOSE 184*  BUN 20  CREATININE 0.45  CALCIUM 9.2   ------------------------------------------------------------------------------------------------------------------  Cardiac Enzymes  Recent Labs Lab 11/27/15 2233  TROPONINI 0.03   ------------------------------------------------------------------------------------------------------------------  RADIOLOGY:  Dg Thoracic Spine 2 View  11/28/2015  CLINICAL DATA:  Status post fall in kitchen, with upper back pain. Initial encounter. EXAM: THORACIC SPINE 2 VIEWS COMPARISON:  CT of the chest performed 09/18/2015 FINDINGS: There is no evidence of fracture or subluxation along the thoracic spine. Thoracic vertebral bodies demonstrate normal height and alignment. Intervertebral disc spaces are preserved. There is grade 2 anterolisthesis of C4 on C5, and grade 1 anterolisthesis of C5 on C6, reflecting underlying facet disease. The visualized portions of both lungs are clear. The mediastinum is unremarkable in appearance. IMPRESSION: 1. No evidence of fracture or  subluxation along the thoracic spine. 2. Mild degenerative change along the cervical spine, as described above. Electronically Signed   By: Garald Balding M.D.   On: 11/28/2015 00:04   Dg Lumbar Spine Complete  11/28/2015  CLINICAL DATA:  Status post fall in kitchen, with lower back pain. Initial encounter. EXAM: LUMBAR SPINE - COMPLETE 4+ VIEW COMPARISON:  CT of the abdomen and pelvis performed 09/18/2015 FINDINGS: There is no evidence of acute fracture or subluxation. Vertebral bodies demonstrate normal height. There is mild grade 1 retrolisthesis of L1 on L2 and of L2 on L3, with mild multilevel disc space narrowing along the lumbar spine, and underlying facet disease. The visualized bowel gas pattern is unremarkable in appearance; air and stool are noted within the colon. The sacroiliac joints are within normal limits. IMPRESSION: 1. No evidence of fracture or subluxation along the lumbar spine. 2. Mild diffuse degenerative change along the lumbar spine. Electronically Signed   By: Garald Balding M.D.   On: 11/28/2015 00:08   Ct Head Wo Contrast  11/27/2015  CLINICAL DATA:  Fall in kitchen sustaining head injury. Posterior hematoma after striking head. EXAM: CT HEAD WITHOUT CONTRAST TECHNIQUE: Contiguous axial images were obtained from the base of the skull through the vertex without intravenous contrast. COMPARISON:  None. FINDINGS: No intracranial hemorrhage, mass effect, or midline shift. Age-related atrophy and chronic small vessel ischemia. Small lacunar infarct in the periventricular right frontal lobe. No hydrocephalus. The basilar cisterns are patent. No evidence of territorial infarct. No intracranial fluid collection. Calvarium is intact. Included paranasal sinuses and mastoid air cells are well aerated. IMPRESSION: 1.  No acute intracranial abnormality. 2. Age related atrophy and chronic small vessel ischemia. Electronically Signed   By: Jeb Levering M.D.   On: 11/27/2015 23:27   Ct Lumbar  Spine Wo Contrast  11/28/2015  CLINICAL DATA:  Status post fall onto ground, with lower back pain. Initial encounter. EXAM: CT LUMBAR SPINE WITHOUT CONTRAST TECHNIQUE: Multidetector CT imaging of the lumbar spine was performed without intravenous contrast administration. Multiplanar CT image reconstructions were also generated. COMPARISON:  Lumbar spine radiographs performed 11/27/2015, and CT of the abdomen and pelvis performed 05/17/2014 FINDINGS: There is no evidence of acute fracture or subluxation along the lumbar spine. There is mild grade 1 retrolisthesis of L1 on L2. Multilevel vacuum phenomenon is noted along the lumbar spine. Scattered anterior and posterior osteophytes are noted along the lumbar spine. Underlying facet disease is noted. Broad-based disc protrusions are noted L1-L2, L3-L4, L4-L5 and L5-S1, with mild associated foraminal narrowing at the lower lumbar spine but no definite evidence of impression on exiting nerve roots. Scattered calcification is noted along the abdominal aorta and its branches.  A nonobstructing 8 mm stone is noted at the lower pole of the left kidney. Scattered diverticulosis is noted along the visualized sigmoid colon. There is mild atrophy involving the paraspinal musculature. Minimal bibasilar airspace opacities may reflect atelectasis or possibly mild infection. IMPRESSION: 1. No evidence of acute fracture or subluxation along the lumbar spine. 2. Mild degenerative change along the lumbar spine, as described above. Multilevel foraminal narrowing along the lower lumbar spine, but no definite evidence of impression on exiting nerve roots. 3. Scattered calcification along the abdominal aorta and its branches. 4. Nonobstructing 8 mm stone at the lower pole of the left kidney. 5. Scattered diverticulosis along the visualized sigmoid colon. 6. Minimal bibasilar airspace opacities may reflect atelectasis or possibly mild infection. Electronically Signed   By: Garald Balding  M.D.   On: 11/28/2015 02:43   Ct Hip Right Wo Contrast  11/28/2015  CLINICAL DATA:  Right hip pain after fall in kitchen. EXAM: CT OF THE RIGHT HIP WITHOUT CONTRAST TECHNIQUE: Multidetector CT imaging of the right hip was performed according to the standard protocol. Multiplanar CT image reconstructions were also generated. COMPARISON:  Radiographs 4 hours prior. FINDINGS: No fracture. Femoral head is seated in the acetabulum. Pubic rami are intact. Age related degenerative change and mild chondrocalcinosis. Pubic symphysis is congruent. No confluent soft tissue hematoma. IMPRESSION: No fracture or subluxation of the right hip. Electronically Signed   By: Jeb Levering M.D.   On: 11/28/2015 02:41   Dg Knee Complete 4 Views Right  11/28/2015  CLINICAL DATA:  Fall in [employer], now with right knee pain. EXAM: RIGHT KNEE - COMPLETE 4+ VIEW COMPARISON:  None. FINDINGS: No fracture or dislocation. Mild medial tibial femoral joint space narrowing. Lateral meniscal chondrocalcinosis. No joint effusion. Quadriceps enthesophyte. IMPRESSION: 1. No acute fracture dislocation. 2. Mild for age osteoarthritis and chondrocalcinosis. Electronically Signed   By: Jeb Levering M.D.   On: 11/28/2015 00:09   Dg Hip Unilat With Pelvis 2-3 Views Right  11/28/2015  CLINICAL DATA:  Fall in [employer], now with right hip pain. EXAM: DG HIP (WITH OR WITHOUT PELVIS) 2-3V RIGHT COMPARISON:  None. FINDINGS: The cortical margins of the bony pelvis are intact. No fracture. Pubic symphysis and sacroiliac joints are congruent. Both femoral heads are well-seated in the respective acetabula. Age-related degenerative change of both hips, left greater than right. Bones under mineralized. IMPRESSION: No fracture or dislocation of the pelvis or right hip. Electronically Signed   By: Jeb Levering M.D.   On: 11/28/2015 00:07    EKG:   Orders placed or performed during the hospital encounter of 11/27/15  . ED EKG  . ED  EKG  . EKG 12-Lead  . EKG 12-Lead    IMPRESSION AND PLAN:  Principal Problem:   Frequent falls - unclear etiology. We will bring in for observation, get carotid Dopplers, echocardiogram to start. Will have physical therapy work with her for further evaluation as well. Something like stroke is less likely, however not entirely ruled out and might be considered based on further evaluation. Active Problems:   Accelerated hypertension - we will use antihypertensive medications when necessary for now to bring her blood pressure to less than 180/100. After 24 hours she can have tighter control of her blood pressure to a goal of less than 160/100.   COPD (chronic obstructive pulmonary disease) ([place]) - continue home meds, not currently in exacerbation   HLD (hyperlipidemia) - continue home meds  All the records are  reviewed and case discussed with ED provider. Management plans discussed with the patient and/or family.  DVT PROPHYLAXIS: SubQ lovenox  GI PROPHYLAXIS: None  ADMISSION STATUS: Observation  CODE STATUS: DO NOT RESUSCITATE Code Status History    This patient does not have a recorded code status. Please follow your organizational policy for patients in this situation.      TOTAL TIME TAKING CARE OF THIS PATIENT: 40 minutes.    Ashley Bultema FIELDING 11/28/2015, 3:49 AM  [facility]  Office  [phone]  CC: Primary care physician; Glendon Axe, MD

## 2015-11-28 NOTE — ED Provider Notes (Signed)
[facility] Emergency Department Provider Note  ____________________________________________  Time seen: Approximately 12:15 AM  I have reviewed the triage vital signs and the nursing notes.   HISTORY  Chief Complaint Fall    HPI Katrina Jefferson is a 82 y.o. female with a history of recurrent falls presenting with head pain, back pain, and right hip pain after fall. Patient states that she was walking in the kitchen trying to answer the ringing phone when she fell "for no apparent reason." She hit her head on the stove, and fell onto the ground resulting in back pain and right hip pain. She was unable to get up after the fall. She denies any LOC. She denies any chest pain, shortness of breath, palpitations, lightheadedness or syncope. She does not know why she fell but does not appear to have had any malaise prior to her fall. She has not had any recent illness including cough or cold symptoms, nausea vomiting or diarrhea.   Past Medical History  Diagnosis Date  . COPD (chronic obstructive pulmonary disease) ([place])   . Arthritis   . Depression   . Diabetes mellitus without complication ([place])   . Hyperlipidemia   . Insomnia   . Adrenal mass, left ([place])   . Osteoporosis   . Retinal hemorrhage     There are no active problems to display for this patient.   Past Surgical History  Procedure Laterality Date  . Esophagogastroduodenoscopy (egd) with propofol N/A 09/17/2015    Procedure: ESOPHAGOGASTRODUODENOSCOPY (EGD) WITH PROPOFOL;  Surgeon: Josefine Class, MD;  Location: [facility] ENDOSCOPY;  Service: Endoscopy;  Laterality: N/A;    Current Outpatient Rx  Name  Route  Sig  Dispense  Refill  . acetaminophen (TYLENOL) 650 MG CR tablet   Oral   Take 1,300 mg by mouth 2 (two) times daily.         . clobetasol cream (TEMOVATE) 0.05 %   Topical   Apply 1 application topically 2 (two) times daily.         Marland Kitchen escitalopram (LEXAPRO) 10 MG tablet  Oral   Take 10 mg by mouth daily.         . fluticasone (FLONASE) 50 MCG/ACT nasal spray   Each Nare   Place into both nostrils daily.         Marland Kitchen HYDROcodone-acetaminophen (NORCO/VICODIN) 5-325 MG tablet   Oral   Take 1 tablet by mouth 2 (two) times daily.         . Loratadine (CLARITIN) 10 MG CAPS   Oral   Take 10 mg by mouth daily.         . metFORMIN (GLUCOPHAGE) 500 MG tablet   Oral   Take by mouth 2 (two) times daily with a meal.         . omeprazole (PRILOSEC) 40 MG capsule   Oral   Take 40 mg by mouth daily.         . promethazine (PHENERGAN) 25 MG tablet   Oral   Take 25 mg by mouth at bedtime as needed for nausea or vomiting.         . ranitidine (ZANTAC) 75 MG tablet   Oral   Take 75 mg by mouth 2 (two) times daily.         . simvastatin (ZOCOR) 40 MG tablet   Oral   Take 40 mg by mouth daily.         . temazepam (RESTORIL) 15 MG capsule  Oral   Take 15 mg by mouth at bedtime as needed for sleep.           Allergies Codeine sulfate; Erythromycin; Sulfa antibiotics; and Ultracet  History reviewed. No pertinent family history.  Social History Social History  Substance Use Topics  . Smoking status: Current Every Day Smoker -- 0.50 packs/day  . Smokeless tobacco: Never Used  . Alcohol Use: No    Review of Systems Constitutional: No fever/chills. No lightheadedness or syncope. No loss of consciousness. Eyes: No visual changes. No blurred or double vision. ENT: No sore throat. No rhinorrhea or congestion. Cardiovascular: Denies chest pain, palpitations. Respiratory: Denies shortness of breath.  No cough. Gastrointestinal: No abdominal pain.  No nausea, no vomiting.  No diarrhea.  No constipation. Genitourinary: Negative for dysuria. Musculoskeletal: Positive back pain positive right hip pain Skin: Negative for rash. Neurological: Negative for headaches. No numbness weakness or tingling. No visual changes or speech changes. No  confusion.  10-point ROS otherwise negative.  ____________________________________________   PHYSICAL EXAM:  VITAL SIGNS: ED Triage Vitals  Enc Vitals Group     BP 11/27/15 2210 195/85 mmHg     Pulse Rate 11/27/15 2210 92     Resp 11/27/15 2210 18     Temp 11/27/15 2210 97.7 F (36.5 C)     Temp Source 11/27/15 2210 Oral     SpO2 11/27/15 2210 94 %     Weight 11/27/15 2210 140 lb (63.504 kg)     Height 11/27/15 2210 5\' 5"  (1.651 m)     Head Cir --      Peak Flow --      Pain Score 11/27/15 2212 8     Pain Loc --      Pain Edu? --      Excl. in [place]? --     Constitutional: Alert and oriented. Well appearing and in no acute distress. Answer question appropriately. Eyes: Conjunctivae are normal.  EOMI. no raccoon eyes. PERRLA. Head: Atraumatic. No obvious swelling or bruising on the scalp or the face. Negative Battle sign. Nose: No congestion/rhinnorhea. No swelling around the nose or septal hematoma. Mouth/Throat: Mucous membranes are moist. No dental injury or malocclusion. Neck: No stridor.  Supple.  No midline C-spine tenderness to palpation, step-offs or deformities. Full range of motion without pain. Cardiovascular: Normal rate, regular rhythm. No murmurs, rubs or gallops.  Respiratory: Normal respiratory effort.  No retractions. Lungs CTAB.  No wheezes, rales or ronchi. Gastrointestinal: Soft and nontender. No distention. No peritoneal signs. Musculoskeletal: Positive lower thoracic and upper lumbar spine tenderness to palpation without overlying swelling or ecchymosis. Positive pain over the right greater trochanter with pain with range of motion and mild pain in the right knee with range of motion. Otherwise, full range of motion of the bilateral shoulders, elbows, wrists, left hip, left knee, and bilateral ankles without pain. Normal sensation to light touch throughout bilateral lower extremities Neurologic:  Normal speech and language. No gross focal neurologic deficits  are appreciated.  Skin:  Skin is warm, dry and intact. No rash noted. Psychiatric: Mood and affect are normal. Speech and behavior are normal.  Normal judgement.  ____________________________________________   LABS (all labs ordered are listed, but only abnormal results are displayed)  Labs Reviewed  CBC  BASIC METABOLIC PANEL  TROPONIN I   ____________________________________________  EKG  EKG is still pending at the time of signout.  ____________________________________________  RADIOLOGY  Dg Thoracic Spine 2 View  11/28/2015  CLINICAL  DATA:  Status post fall in kitchen, with upper back pain. Initial encounter. EXAM: THORACIC SPINE 2 VIEWS COMPARISON:  CT of the chest performed 09/18/2015 FINDINGS: There is no evidence of fracture or subluxation along the thoracic spine. Thoracic vertebral bodies demonstrate normal height and alignment. Intervertebral disc spaces are preserved. There is grade 2 anterolisthesis of C4 on C5, and grade 1 anterolisthesis of C5 on C6, reflecting underlying facet disease. The visualized portions of both lungs are clear. The mediastinum is unremarkable in appearance. IMPRESSION: 1. No evidence of fracture or subluxation along the thoracic spine. 2. Mild degenerative change along the cervical spine, as described above. Electronically Signed   By: Garald Balding M.D.   On: 11/28/2015 00:04   Dg Lumbar Spine Complete  11/28/2015  CLINICAL DATA:  Status post fall in kitchen, with lower back pain. Initial encounter. EXAM: LUMBAR SPINE - COMPLETE 4+ VIEW COMPARISON:  CT of the abdomen and pelvis performed 09/18/2015 FINDINGS: There is no evidence of acute fracture or subluxation. Vertebral bodies demonstrate normal height. There is mild grade 1 retrolisthesis of L1 on L2 and of L2 on L3, with mild multilevel disc space narrowing along the lumbar spine, and underlying facet disease. The visualized bowel gas pattern is unremarkable in appearance; air and stool are  noted within the colon. The sacroiliac joints are within normal limits. IMPRESSION: 1. No evidence of fracture or subluxation along the lumbar spine. 2. Mild diffuse degenerative change along the lumbar spine. Electronically Signed   By: Garald Balding M.D.   On: 11/28/2015 00:08   Ct Head Wo Contrast  11/27/2015  CLINICAL DATA:  Fall in kitchen sustaining head injury. Posterior hematoma after striking head. EXAM: CT HEAD WITHOUT CONTRAST TECHNIQUE: Contiguous axial images were obtained from the base of the skull through the vertex without intravenous contrast. COMPARISON:  None. FINDINGS: No intracranial hemorrhage, mass effect, or midline shift. Age-related atrophy and chronic small vessel ischemia. Small lacunar infarct in the periventricular right frontal lobe. No hydrocephalus. The basilar cisterns are patent. No evidence of territorial infarct. No intracranial fluid collection. Calvarium is intact. Included paranasal sinuses and mastoid air cells are well aerated. IMPRESSION: 1.  No acute intracranial abnormality. 2. Age related atrophy and chronic small vessel ischemia. Electronically Signed   By: Jeb Levering M.D.   On: 11/27/2015 23:27   Dg Knee Complete 4 Views Right  11/28/2015  CLINICAL DATA:  Fall in [employer], now with right knee pain. EXAM: RIGHT KNEE - COMPLETE 4+ VIEW COMPARISON:  None. FINDINGS: No fracture or dislocation. Mild medial tibial femoral joint space narrowing. Lateral meniscal chondrocalcinosis. No joint effusion. Quadriceps enthesophyte. IMPRESSION: 1. No acute fracture dislocation. 2. Mild for age osteoarthritis and chondrocalcinosis. Electronically Signed   By: Jeb Levering M.D.   On: 11/28/2015 00:09   Dg Hip Unilat With Pelvis 2-3 Views Right  11/28/2015  CLINICAL DATA:  Fall in [employer], now with right hip pain. EXAM: DG HIP (WITH OR WITHOUT PELVIS) 2-3V RIGHT COMPARISON:  None. FINDINGS: The cortical margins of the bony pelvis are intact. No  fracture. Pubic symphysis and sacroiliac joints are congruent. Both femoral heads are well-seated in the respective acetabula. Age-related degenerative change of both hips, left greater than right. Bones under mineralized. IMPRESSION: No fracture or dislocation of the pelvis or right hip. Electronically Signed   By: Jeb Levering M.D.   On: 11/28/2015 00:07    ____________________________________________   PROCEDURES  Procedure(s) performed: None  Critical Care performed:  No ____________________________________________   INITIAL IMPRESSION / ASSESSMENT AND PLAN / ED COURSE  Pertinent labs & imaging results that were available during my care of the patient were reviewed by me and considered in my medical decision making (see chart for details).  82 y.o. female with a fall for unknown reasons presenting with head pain, thoracic and lumbar spine pain, right hip pain, and right knee pain. The patient did not have a syncopal episode but it is unclear why she fell. I will get basic labs, and EKG to evaluate for obvious causes of fall. We will also get imaging to rule out any acute injury from her fall.  ----------------------------------------- 12:20 AM on 11/28/2015 -----------------------------------------  The patient has no evidence of acute injury from her fall. Exam awaiting her EKG and labs, which Dr. Beather Arbour will follow up as I have signed the patient out to her. If her workup is reassuring, anticipate discharge home with close PMD follow-up.  ----------------------------------------- 12:28 AM on 11/28/2015 -----------------------------------------  ED ECG REPORT I, Eula Listen, the attending physician, personally viewed and interpreted this ECG.   Date: 11/28/2015  EKG Time: 2218  Rate: 86  Rhythm: normal sinus rhythm  Axis: Normal  Intervals:none  ST&T Change: No ST changes. No ischemic changes.  ED ECG REPORT I, Eula Listen, the attending physician,  personally viewed and interpreted this ECG.   Date: 11/28/2015  EKG Time: 0 22  Rate: 75  Rhythm: unchanged from previous tracings normal sinus rhythm  Axis: Normal  Intervals:none  ST&T Change: No ST elevation.   ____________________________________________  FINAL CLINICAL IMPRESSION(S) / ED DIAGNOSES  Final diagnoses:  Head injury, initial encounter  Thoracic myofascial strain, initial encounter  Lumbar strain, initial encounter  Right hip pain  Right knee pain      NEW MEDICATIONS STARTED DURING THIS VISIT:  New Prescriptions   No medications on file     Eula Listen, MD 11/28/15 [phone]

## 2015-11-28 NOTE — Evaluation (Signed)
Physical Therapy Evaluation Patient Details Name: Katrina Jefferson MRN: [id number] DOB: [birth year] Today's Date: 11/28/2015   History of Present Illness  Patient is an 82y/o female that presents after sustaining multiple falls at home of unknown etiology. Patient noted to have BP in 200s in ED. Imaging of L-spine, T-spine, CT of head, imaging of hips and pelvis all negative for any acute changes.   Clinical Impression  Patient admitted with multiple falls recently with no clear etiology as these do not seem mechanical. Patient screened with imaging throughout [facility] system with no clear injuries identified. Patient begins to complain of severe mid thoracic back pain with supine to sit transfer. Patient was unable to tolerate sitting whatsoever, and thus unable to transfer OOB mobility. At this point other sources of mid back pain should be explored such as cardiac abnormalities given the negative workup with orthopedic imaging. Patient unable to tolerate OOB mobility currently and would benefit from short term rehabilitation to increase her tolerance for OOB mobility.     Follow Up Recommendations SNF    Equipment Recommendations  Rolling walker with 5" wheels    Recommendations for Other Services       Precautions / Restrictions Precautions Precautions: Fall Restrictions Weight Bearing Restrictions: No      Mobility  Bed Mobility Overal bed mobility: Needs Assistance Bed Mobility: Supine to Sit;Sit to Supine     Supine to sit: Mod assist Sit to supine: Mod assist   General bed mobility comments: Patient experiencing significant pain in retro-sternal region and unable to provide full effort secondary to pain.   Transfers                    Ambulation/Gait                Stairs            Wheelchair Mobility    Modified Rankin (Stroke Patients Only)       Balance Overall balance assessment: Needs assistance Sitting-balance support: Feet  supported;No upper extremity supported Sitting balance-Leahy Scale: Fair Sitting balance - Comments: Patient unable to tolerate sitting secondary to marked increase in retrosternal pain, therefore her sitting balance was much below baseline.                                      Pertinent Vitals/Pain Pain Assessment:  (Patient begins to complain or terrible mid-back retrosternal pain once in sitting, returns to baseline once returned to supine. )    Home Living Family/patient expects to be discharged to:: Private residence Living Arrangements: Other relatives Available Help at Discharge: Family;Available PRN/intermittently Type of Home: House Home Access: Stairs to enter   Entrance Stairs-Number of Steps: 1   Home Equipment: [place] - 2 wheels;Cane - single point      Prior Function Level of Independence: Independent with assistive device(s)         Comments: Patient has previously been very active around the house with use of SPC primarily if she were to use an AD.      Hand Dominance        Extremity/Trunk Assessment   Upper Extremity Assessment:  (Unable to assess secondary to pain)           Lower Extremity Assessment:  (Unable to assess secondary to pain)         Communication   Communication: No difficulties  Cognition  Arousal/Alertness: Awake/alert Behavior During Therapy: WFL for tasks assessed/performed Overall Cognitive Status: Within Functional Limits for tasks assessed                      General Comments      Exercises        Assessment/Plan    PT Assessment Patient needs continued PT services  PT Diagnosis Difficulty walking;Generalized weakness;Acute pain   PT Problem List Decreased strength;Decreased knowledge of use of DME;Decreased activity tolerance;Decreased balance;Decreased mobility  PT Treatment Interventions DME instruction;Gait training;Stair training;Therapeutic activities;Therapeutic exercise;Balance  training   PT Goals (Current goals can be found in the Care Plan section) Acute Rehab PT Goals Patient Stated Goal: To return home when able.  PT Goal Formulation: With patient Time For Goal Achievement: 12/12/15 Potential to Achieve Goals: Fair    Frequency Min 2X/week   Barriers to discharge Inaccessible home environment;Decreased caregiver support Patient lives alone and as of right now is unable to tolerate any OOB mobility.     Co-evaluation               End of Session   Activity Tolerance: Patient limited by pain Patient left: in bed;with call bell/phone within reach;with bed alarm set;with family/visitor present Nurse Communication: Mobility status (Retrosternal pain)    Functional Assessment Tool Used: Clinical judgement  Functional Limitation: Mobility: Walking and moving around Mobility: Walking and Moving Around Current Status [id number]): At least 80 percent but less than 100 percent impaired, limited or restricted Mobility: Walking and Moving Around Goal Status [phone]): At least 60 percent but less than 80 percent impaired, limited or restricted    Time: 1116-1135 PT Time Calculation (min) (ACUTE ONLY): 19 min   Charges:   PT Evaluation $PT Eval High Complexity: 1 Procedure     PT G Codes:   PT G-Codes **NOT FOR INPATIENT CLASS** Functional Assessment Tool Used: Clinical judgement  Functional Limitation: Mobility: Walking and moving around Mobility: Walking and Moving Around Current Status [id number]): At least 80 percent but less than 100 percent impaired, limited or restricted Mobility: Walking and Moving Around Goal Status [phone]): At least 60 percent but less than 80 percent impaired, limited or restricted    Kerman Passey, PT, DPT    11/28/2015, 2:58 PM

## 2015-11-28 NOTE — ED Notes (Signed)
Dr. Willis at bedside  

## 2015-11-28 NOTE — Discharge Instructions (Signed)
Please read the instructions about how to use a walker and how to use a cane and always use one of the stools to help you to prevent falls at home.  Return to the emergency department if you develop severe pain, numbness tingling or weakness, fainting, chest pain, or any other symptoms concerning to you.

## 2015-11-28 NOTE — ED Notes (Signed)
Pt transported to CT via stretcher.  

## 2015-11-29 LAB — GLUCOSE, CAPILLARY
GLUCOSE-CAPILLARY: 213 mg/dL — AB (ref 65–99)
Glucose-Capillary: 151 mg/dL — ABNORMAL HIGH (ref 65–99)
Glucose-Capillary: 164 mg/dL — ABNORMAL HIGH (ref 65–99)

## 2015-11-29 MED ORDER — LORAZEPAM 1 MG PO TABS: 1.0000 mg | ORAL_TABLET | Freq: Every day | ORAL | Status: DC | PRN

## 2015-11-29 MED ORDER — MORPHINE SULFATE (PF) 2 MG/ML IV SOLN
2.0000 mg | Freq: Four times a day (QID) | INTRAVENOUS | Status: DC | PRN
  Administered 2015-11-29: 2 mg via INTRAVENOUS
  Filled 2015-11-29: qty 1

## 2015-11-29 MED ORDER — FLUTICASONE PROPIONATE 50 MCG/ACT NA SUSP
1.0000 | Freq: Every day | NASAL | Status: DC
  Administered 2015-11-29 – 2015-11-30 (×2): 1 via NASAL
  Filled 2015-11-29: qty 16

## 2015-11-29 NOTE — Progress Notes (Signed)
Patient c/o of pain at chest and lower back. Patient stated "the lady that did my echo was pressing to hard on my chest which is causing the pain in my chest area". Nurse ask patient to rate her pain. Patient stated that her pain was a 8/10. Patient stated that she can not sleep.  Nursed informed Dr. Darvin Neighbours about the above information. New orders: Lorazepam1mg , and morphine 2mg  every 6 hours.

## 2015-11-29 NOTE — NC FL2 (Signed)
[place] LEVEL OF CARE SCREENING TOOL     IDENTIFICATION  Patient Name: Katrina Jefferson Birthdate: Feb 15, 1924 Sex: female Admission Date (Current Location): 11/27/2015  [place] and [place] Number:  Engineering geologist and Address:  [facility], [address] [place], [place] [address]      Provider Number: [id number]  Attending Physician Name and Address:  Epifanio Lesches, MD  Relative Name and Phone Number:       Current Level of Care: Hospital Recommended Level of Care:   Prior Approval Number:    Date Approved/Denied:   PASRR Number:  ([id number])  Discharge Plan: SNF    Current Diagnoses: Patient Active Problem List   Diagnosis Date Noted  . Frequent falls 11/28/2015  . COPD (chronic obstructive pulmonary disease) ([place]) 11/28/2015  . HLD (hyperlipidemia) 11/28/2015  . Accelerated hypertension 11/28/2015  . Falls 11/28/2015    Orientation RESPIRATION BLADDER Height & Weight     Self, Time, Situation, Place  O2 (Nasal Cannula 2 (L/min) ) Continent Weight: 145 lb 4.8 oz (65.908 kg) Height:  5\' 3"  (160 cm)  BEHAVIORAL SYMPTOMS/MOOD NEUROLOGICAL BOWEL NUTRITION STATUS   (None)  (None) Continent Diet (heart healthy/carb modified )  AMBULATORY STATUS COMMUNICATION OF NEEDS Skin   Extensive Assist Verbally Normal                       Personal Care Assistance Level of Assistance  Bathing, Feeding, Dressing Bathing Assistance: Limited assistance Feeding assistance: Independent Dressing Assistance: Limited assistance     Functional Limitations Info  Sight, Hearing, Speech Sight Info: Adequate Hearing Info: Adequate Speech Info: Adequate    SPECIAL CARE FACTORS FREQUENCY  PT (By licensed PT)     PT Frequency:  (5)              Contractures      Additional Factors Info  Code Status, Allergies, Insulin Sliding Scale Code Status Info:  (DNR) Allergies Info:  (Codeine Sulfate, Erythromycin,  Sulfa Antibiotics, Ultracet)   Insulin Sliding Scale Info:  (insulin aspart (novoLOG) injection 0-5 Units 0-5 Units, Subcutaneous, Daily at bedtime & insulin aspart (novoLOG) injection 0-9 Units 0-9 Units, Subcutaneous, 3 times daily with meals )       Current Medications (11/29/2015):  This is the current hospital active medication list Current Facility-Administered Medications  Medication Dose Route Frequency Provider Last Rate Last Dose  . acetaminophen (TYLENOL) tablet 650 mg  650 mg Oral Q6H PRN Lance Coon, MD   650 mg at 11/29/15 0000   Or  . acetaminophen (TYLENOL) suppository 650 mg  650 mg Rectal Q6H PRN Lance Coon, MD      . aspirin chewable tablet 81 mg  81 mg Oral Daily Epifanio Lesches, MD   81 mg at 11/29/15 0831  . enoxaparin (LOVENOX) injection 40 mg  40 mg Subcutaneous Q24H Lance Coon, MD   40 mg at 11/29/15 [id number]  . escitalopram (LEXAPRO) tablet 10 mg  10 mg Oral Daily Lance Coon, MD   10 mg at 11/29/15 0831  . hydrALAZINE (APRESOLINE) injection 10 mg  10 mg Intravenous Q4H PRN Lance Coon, MD   10 mg at 11/29/15 0002  . hydrALAZINE (APRESOLINE) tablet 25 mg  25 mg Oral 3 times per day Epifanio Lesches, MD   25 mg at 11/29/15 0539  . insulin aspart (novoLOG) injection 0-5 Units  0-5 Units Subcutaneous QHS Lance Coon, MD   0 Units at 11/28/15 2240  .  insulin aspart (novoLOG) injection 0-9 Units  0-9 Units Subcutaneous TID WC Lance Coon, MD   2 Units at 11/29/15 [phone]  . LORazepam (ATIVAN) tablet 1 mg  1 mg Oral Daily PRN Srikar Sudini, MD      . morphine 2 MG/ML injection 2 mg  2 mg Intravenous Q6H PRN Hillary Bow, MD   2 mg at 11/29/15 0221  . ondansetron (ZOFRAN) tablet 4 mg  4 mg Oral Q6H PRN Lance Coon, MD       Or  . ondansetron [facility]) injection 4 mg  4 mg Intravenous Q6H PRN Lance Coon, MD   4 mg at 11/29/15 0221  . oxyCODONE (Oxy IR/ROXICODONE) immediate release tablet 5 mg  5 mg Oral Q4H PRN Lance Coon, MD   5 mg at 11/28/15 2233  .  pantoprazole (PROTONIX) EC tablet 40 mg  40 mg Oral Daily Lance Coon, MD   40 mg at 11/29/15 0831  . simvastatin (ZOCOR) tablet 40 mg  40 mg Oral Daily Lance Coon, MD   40 mg at 11/29/15 0831  . sodium chloride flush (NS) 0.9 % injection 3 mL  3 mL Intravenous Q12H Lance Coon, MD   3 mL at 11/29/15 [id number]     Discharge Medications: Please see discharge summary for a list of discharge medications.  Relevant Imaging Results:  Relevant Lab Results:   Additional Information  (SSN [id number])  Lorenso Quarry Hermela Hardt, LCSW

## 2015-11-29 NOTE — Clinical Social Work Note (Signed)
Clinical Social Work Assessment  Patient Details  Name: Katrina Jefferson MRN: [id number] Date of Birth: April 24, 1924  Date of referral:  11/29/15               Reason for consult:  Discharge Planning                Permission sought to share information with:  Family Supports Permission granted to share information::  Yes, Verbal Permission Granted  Name::        Agency::     Relationship::   [name] UUVO [phone])  Contact Information:     Housing/Transportation Living arrangements for the past 2 months:  [place] of Information:  Patient, Other (Comment Required) Red Christians [phone]) Patient Interpreter Needed:  None Criminal Activity/Legal Involvement Pertinent to Current Situation/Hospitalization:  No - Comment as needed Significant Relationships:  Other Family Members Red Christians [phone]) Lives with:  Other (Comment) [name] [facility] [phone]) Do you feel safe going back to the place where you live?  Yes Need for family participation in patient care:  Yes (Comment) Red Christians [phone])  Care giving concerns:  Patient and her Niece- Red Christians [phone] feel that patient will need SNF at discharge.    Social Worker assessment / plan:  CSW was consulted by PT stating that patient will need SNF placement at discharge. CSW met with patient and her niece-Nancy Gant [phone] at bedside. Verbal permission granted to speak about discharge plans in front of niece. CSW explained her role. Per patient she feels that she may need SNF placement at discharge. She reports that she is unfamiliar with SNFs in [facility]. Provided a SNF list. Patient gave CSW verbal permission to refer her to SNFs in [facility]. No preference at this time. Requested time to gain preference.   FL2/ PASRR completed and faxed to SNFs in [facility].   Employment status:  Retired Forensic scientist:  Medicare PT Recommendations:  [place] / Referral to community resources:  [place]  Patient/Family's Response to care:  Patient and her niece are in agreement that patient should go to SNF at discharge.   Patient/Family's Understanding of and Emotional Response to Diagnosis, Current Treatment, and Prognosis:  Patient and her niece were receptive to CSW and understand her role. They report they appreciated CSW's assistance in finding a SNF placement.   Emotional Assessment Appearance:  Appears stated age Attitude/Demeanor/Rapport:   (None) Affect (typically observed):  Accepting, Calm, Pleasant Orientation:  Oriented to Self, Oriented to Place, Oriented to  Time, Oriented to Situation Alcohol / Substance use:  Not Applicable Psych involvement (Current and /or in the community):  No (Comment)  Discharge Needs  Concerns to be addressed:  Discharge Planning Concerns Readmission within the last 30 days:  No Current discharge risk:  Chronically ill Barriers to Discharge:  Continued Medical Work up   [employer], LCSW 11/29/2015, 3:37 PM

## 2015-11-29 NOTE — Progress Notes (Signed)
[place] [facility] [place] NAME: Katrina Jefferson    MR#:  [id number]  DATE OF BIRTH:  Apr 27, 1924  SUBJECTIVE: Patient seen at bedside , and  chest wall pain after the echocardiogram.  physical therapy recommends SNF. She wants to walk with physical therapy again today.   CHIstill is negative EF COMPLAINT:   Chief Complaint  Patient presents with  . Fall    REVIEW OF SYSTEMS:    Review of Systems  Constitutional: Negative for fever and chills.  HENT: Negative for hearing loss.   Eyes: Negative for blurred vision, double vision and photophobia.  Respiratory: Negative for cough, hemoptysis and shortness of breath.   Cardiovascular: Negative for palpitations, orthopnea and leg swelling.  Gastrointestinal: Negative for vomiting, abdominal pain and diarrhea.  Genitourinary: Negative for dysuria and urgency.  Musculoskeletal: Positive for back pain, joint pain and falls. Negative for myalgias and neck pain.  Skin: Negative for rash.  Neurological: Positive for dizziness. Negative for focal weakness, seizures, weakness and headaches.  Psychiatric/Behavioral: Negative for memory loss. The patient does not have insomnia.     Nutrition:  Tolerating Diet: Tolerating PT:      DRUG ALLERGIES:   Allergies  Allergen Reactions  . Codeine Sulfate   . Erythromycin   . Sulfa Antibiotics   . Ultracet [Tramadol-Acetaminophen]     VITALS:  Blood pressure 164/66, pulse 92, temperature 99 F (37.2 C), temperature source Oral, resp. rate 18, height 5\' 3"  (1.6 m), weight 65.908 kg (145 lb 4.8 oz), SpO2 82 %.  PHYSICAL EXAMINATION:   Physical Exam  GENERAL:  82 y.o.-year-old patient lying in the bed with no acute distress.  EYES: Pupils equal, round, reactive to light and accommodation. No scleral icterus. Extraocular muscles intact.  HEENT: Head atraumatic, normocephalic. Oropharynx and nasopharynx clear.  NECK:  Supple, no jugular venous  distention. No thyroid enlargement, no tenderness.  LUNGS: Normal breath sounds bilaterally, no wheezing, rales,rhonchi or crepitation. No use of accessory muscles of respiration.  CARDIOVASCULAR: S1, S2 normal. No murmurs, rubs, or gallops.  ABDOMEN: Soft, nontender, nondistended. Bowel sounds present. No organomegaly or mass.  EXTREMITIES: No pedal edema, cyanosis, or clubbing.  NEUROLOGIC: Cranial nerves II through XII are intact. Muscle strength 5/5 in all extremities. Sensation intact. Gait not checked.  PSYCHIATRIC: The patient is alert and oriented x 3.  SKIN: No obvious rash, lesion, or ulcer.    LABORATORY PANEL:   CBC  Recent Labs Lab 11/27/15 2233  WBC 5.1  HGB 12.4  HCT 37.5  PLT 227   ------------------------------------------------------------------------------------------------------------------  Chemistries   Recent Labs Lab 11/27/15 2233  NA 138  K 3.6  CL 106  CO2 25  GLUCOSE 184*  BUN 20  CREATININE 0.45  CALCIUM 9.2   ------------------------------------------------------------------------------------------------------------------  Cardiac Enzymes  Recent Labs Lab 11/27/15 2233  TROPONINI 0.03   ------------------------------------------------------------------------------------------------------------------  RADIOLOGY:  Dg Thoracic Spine 2 View  11/28/2015  CLINICAL DATA:  Status post fall in kitchen, with upper back pain. Initial encounter. EXAM: THORACIC SPINE 2 VIEWS COMPARISON:  CT of the chest performed 09/18/2015 FINDINGS: There is no evidence of fracture or subluxation along the thoracic spine. Thoracic vertebral bodies demonstrate normal height and alignment. Intervertebral disc spaces are preserved. There is grade 2 anterolisthesis of C4 on C5, and grade 1 anterolisthesis of C5 on C6, reflecting underlying facet disease. The visualized portions of both lungs are clear. The mediastinum is unremarkable in appearance. IMPRESSION: 1. No  evidence  of fracture or subluxation along the thoracic spine. 2. Mild degenerative change along the cervical spine, as described above. Electronically Signed   By: Garald Balding M.D.   On: 11/28/2015 00:04   Dg Lumbar Spine Complete  11/28/2015  CLINICAL DATA:  Status post fall in kitchen, with lower back pain. Initial encounter. EXAM: LUMBAR SPINE - COMPLETE 4+ VIEW COMPARISON:  CT of the abdomen and pelvis performed 09/18/2015 FINDINGS: There is no evidence of acute fracture or subluxation. Vertebral bodies demonstrate normal height. There is mild grade 1 retrolisthesis of L1 on L2 and of L2 on L3, with mild multilevel disc space narrowing along the lumbar spine, and underlying facet disease. The visualized bowel gas pattern is unremarkable in appearance; air and stool are noted within the colon. The sacroiliac joints are within normal limits. IMPRESSION: 1. No evidence of fracture or subluxation along the lumbar spine. 2. Mild diffuse degenerative change along the lumbar spine. Electronically Signed   By: Garald Balding M.D.   On: 11/28/2015 00:08   Ct Head Wo Contrast  11/27/2015  CLINICAL DATA:  Fall in kitchen sustaining head injury. Posterior hematoma after striking head. EXAM: CT HEAD WITHOUT CONTRAST TECHNIQUE: Contiguous axial images were obtained from the base of the skull through the vertex without intravenous contrast. COMPARISON:  None. FINDINGS: No intracranial hemorrhage, mass effect, or midline shift. Age-related atrophy and chronic small vessel ischemia. Small lacunar infarct in the periventricular right frontal lobe. No hydrocephalus. The basilar cisterns are patent. No evidence of territorial infarct. No intracranial fluid collection. Calvarium is intact. Included paranasal sinuses and mastoid air cells are well aerated. IMPRESSION: 1.  No acute intracranial abnormality. 2. Age related atrophy and chronic small vessel ischemia. Electronically Signed   By: Jeb Levering M.D.   On:  11/27/2015 23:27   Ct Angio Neck W/cm &/or Wo/cm  11/28/2015  CLINICAL DATA:  Dizziness. EXAM: CT ANGIOGRAPHY NECK TECHNIQUE: Multidetector CT imaging of the neck was performed using the standard protocol during bolus administration of intravenous contrast. Multiplanar CT image reconstructions and MIPs were obtained to evaluate the vascular anatomy. Carotid stenosis measurements (when applicable) are obtained utilizing NASCET criteria, using the distal internal carotid diameter as the denominator. CONTRAST:  127mL OMNIPAQUE IOHEXOL 350 MG/ML SOLN COMPARISON:  Carotid Doppler from earlier today FINDINGS: Aortic arch: Atherosclerosis without visualized aneurysm or dissection. Three vessel branching. Right carotid system: Ostium partly obscured by intravenous contrast and streak. No stenosis at this level is suspected. There is calcified atherosclerosis at the bifurcation with downstream noncalcified proximal ICA plaque. Maximal stenosis measures ~50%. No evidence of dissection or plaque ulceration. Retropharyngeal ICA course. Left carotid system: Prominent calcified plaque at the bifurcation with ~50% stenosis. Moderate proximale ECA narrowing. No evidence of dissection or plaque ulceration. Retropharyngeal ICA course. Vertebral arteries:Bilateral proximal subclavian arteries show no flow limiting stenosis. Strong left dominance. No occlusion or stenosis. Skeleton: Severe multilevel right-sided facet arthropathy with C4-5 anterolisthesis and milder C5-6 anterolisthesis. No evidence of acute fracture. Other neck: Biapical centrilobular emphysema 24 mm right thyroid mass without demonstrable change since cervical spine MRI in 2005. Given stability and patient age this is considered incidental. IMPRESSION: 1. Atherosclerosis with bilateral ~50% proximal ICA stenosis. 2. Non stenotic bilateral vertebral arteries. Electronically Signed   By: Monte Fantasia M.D.   On: 11/28/2015 16:32   Ct Lumbar Spine Wo  Contrast  11/28/2015  CLINICAL DATA:  Status post fall onto ground, with lower back pain. Initial encounter. EXAM: CT LUMBAR SPINE WITHOUT  CONTRAST TECHNIQUE: Multidetector CT imaging of the lumbar spine was performed without intravenous contrast administration. Multiplanar CT image reconstructions were also generated. COMPARISON:  Lumbar spine radiographs performed 11/27/2015, and CT of the abdomen and pelvis performed 05/17/2014 FINDINGS: There is no evidence of acute fracture or subluxation along the lumbar spine. There is mild grade 1 retrolisthesis of L1 on L2. Multilevel vacuum phenomenon is noted along the lumbar spine. Scattered anterior and posterior osteophytes are noted along the lumbar spine. Underlying facet disease is noted. Broad-based disc protrusions are noted L1-L2, L3-L4, L4-L5 and L5-S1, with mild associated foraminal narrowing at the lower lumbar spine but no definite evidence of impression on exiting nerve roots. Scattered calcification is noted along the abdominal aorta and its branches. A nonobstructing 8 mm stone is noted at the lower pole of the left kidney. Scattered diverticulosis is noted along the visualized sigmoid colon. There is mild atrophy involving the paraspinal musculature. Minimal bibasilar airspace opacities may reflect atelectasis or possibly mild infection. IMPRESSION: 1. No evidence of acute fracture or subluxation along the lumbar spine. 2. Mild degenerative change along the lumbar spine, as described above. Multilevel foraminal narrowing along the lower lumbar spine, but no definite evidence of impression on exiting nerve roots. 3. Scattered calcification along the abdominal aorta and its branches. 4. Nonobstructing 8 mm stone at the lower pole of the left kidney. 5. Scattered diverticulosis along the visualized sigmoid colon. 6. Minimal bibasilar airspace opacities may reflect atelectasis or possibly mild infection. Electronically Signed   By: Garald Balding M.D.   On:  11/28/2015 02:43   Ct Hip Right Wo Contrast  11/28/2015  CLINICAL DATA:  Right hip pain after fall in kitchen. EXAM: CT OF THE RIGHT HIP WITHOUT CONTRAST TECHNIQUE: Multidetector CT imaging of the right hip was performed according to the standard protocol. Multiplanar CT image reconstructions were also generated. COMPARISON:  Radiographs 4 hours prior. FINDINGS: No fracture. Femoral head is seated in the acetabulum. Pubic rami are intact. Age related degenerative change and mild chondrocalcinosis. Pubic symphysis is congruent. No confluent soft tissue hematoma. IMPRESSION: No fracture or subluxation of the right hip. Electronically Signed   By: Jeb Levering M.D.   On: 11/28/2015 02:41   US Carotid Bilateral  11/28/2015  CLINICAL DATA:  Fall.  Hypertension. EXAM: BILATERAL CAROTID DUPLEX ULTRASOUND TECHNIQUE: Pearline Cables scale imaging, color Doppler and duplex ultrasound were performed of bilateral carotid and vertebral arteries in the neck. COMPARISON:  CT 11/27/2015.  MRI 11/02/2008. FINDINGS: Criteria: Quantification of carotid stenosis is based on velocity parameters that correlate the residual internal carotid diameter with NASCET-based stenosis levels, using the diameter of the distal internal carotid lumen as the denominator for stenosis measurement. The following velocity measurements were obtained: RIGHT ICA:  174/47 cm/sec CCA:  [id number] cm/sec SYSTOLIC ICA/CCA RATIO:  1.3 DIASTOLIC ICA/CCA RATIO:  3.7 ECA:  175 cm/sec LEFT ICA:  182/41 cm/sec CCA:  [id number] cm/sec SYSTOLIC ICA/CCA RATIO:  1.1 DIASTOLIC ICA/CCA RATIO:  1.8 ECA:  233 cm/sec RIGHT CAROTID ARTERY: Calcified right carotid bifurcation and proximal ICA plaque. Elevated flow velocities. RIGHT VERTEBRAL ARTERY:  Patent antegrade flow. LEFT CAROTID ARTERY: Calcified left carotid bifurcation and proximal ICA plaque. Elevated flow velocities. LEFT VERTEBRAL ARTERY:  Patent antegrade flow. IMPRESSION: 1. Bilateral calcified carotid bifurcation proximal  ICA atherosclerotic vascular plaque. Degree of stenosis in the 50-69% range bilaterally. 2. Vertebral arteries are patent antegrade flow. Electronically Signed   By: Marcello Moores  Register   On: 11/28/2015 10:21   Dg  Knee Complete 4 Views Right  11/28/2015  CLINICAL DATA:  Fall in [employer], now with right knee pain. EXAM: RIGHT KNEE - COMPLETE 4+ VIEW COMPARISON:  None. FINDINGS: No fracture or dislocation. Mild medial tibial femoral joint space narrowing. Lateral meniscal chondrocalcinosis. No joint effusion. Quadriceps enthesophyte. IMPRESSION: 1. No acute fracture dislocation. 2. Mild for age osteoarthritis and chondrocalcinosis. Electronically Signed   By: Jeb Levering M.D.   On: 11/28/2015 00:09   Dg Hip Unilat With Pelvis 2-3 Views Right  11/28/2015  CLINICAL DATA:  Fall in [employer], now with right hip pain. EXAM: DG HIP (WITH OR WITHOUT PELVIS) 2-3V RIGHT COMPARISON:  None. FINDINGS: The cortical margins of the bony pelvis are intact. No fracture. Pubic symphysis and sacroiliac joints are congruent. Both femoral heads are well-seated in the respective acetabula. Age-related degenerative change of both hips, left greater than right. Bones under mineralized. IMPRESSION: No fracture or dislocation of the pelvis or right hip. Electronically Signed   By: Jeb Levering M.D.   On: 11/28/2015 00:07     ASSESSMENT AND PLAN:   Principal Problem:   Frequent falls Active Problems:   COPD (chronic obstructive pulmonary disease) (HCC)   HLD (hyperlipidemia)   Accelerated hypertension   Falls   1. Frequent falls secondary to dizziness: Patient workup including showed no fracture in the knee thoracic or lumbar areas. Patient has a right distal radius fracture by previous x-ray of the right wrist on 9 February so she has  Splint. physical therapy recommends SNF placement  2. dizzinessSecondary to bilateral carotid disease, moderate disease: Start on aspirin. Statins. the catheter just  shows bilateral bases but around  50% Stenosis Only #3. Multiple falls physical therapy consult. Has lots of back pain issues and in U oxycodone for pain control. #4 uncontrolled hypertension: Patient has no evidence of fall high blood pressure at home. Echocardiogram, start her on hydralazine 25 mg 3 times a day. disposition ;physical therapy consult again today and see if she can walk if not she needs to go to rehabilitation before she goes home. Discussed with the patient and patient's niece.  All the records are reviewed and case discussed with Care Management/Social Workerr. Management plans discussed with the patient, family and they are in agreement.  CODE STATUS: DO NOT RESUSCITATE  TOTAL TIME TAKING CARE OF THIS PATIENT: 35minutes.   POSSIBLE D/C IN 1-2 DAYS, DEPENDING ON CLINICAL CONDITION.   Epifanio Lesches M.D on 11/29/2015 at 11:49 AM  Between 7am to 6pm - Pager - [phone]  After 6pm go to [email or web address] - password [email or web address] [facility]  Office  [phone]  CC: Primary care physician; Glendon Axe, MD

## 2015-11-29 NOTE — Progress Notes (Signed)
Physical Therapy Treatment Patient Details Name: Katrina Jefferson MRN: [id number] DOB: [birth year] Today's Date: 11/29/2015    History of Present Illness Patient is an 82y/o female that presents after sustaining multiple falls at home of unknown etiology. Patient noted to have BP in 200s in ED. Imaging of L-spine, T-spine, CT of head, imaging of hips and pelvis all negative for any acute changes.     PT Comments    Pt reports continued chest pain which she attributes to ultrasound yesterday and back pain from fall but agrees to participate.  Pt with significant improvement with bed mobility and sitting tolerance.  Pt was able to stand and transfer to chair at bedside today but presents with significant balance deficits.  Pt required min assist to prevent falls posteriorly and to right.  She did use a wide based quad cane to transfer stating "I don't think I can use a cane". Pt may do better with platform (R due to brace on right wrist) walker for balance.    Follow Up Recommendations  SNF     Equipment Recommendations       Recommendations for Other Services       Precautions / Restrictions Precautions Precautions: Fall Restrictions Weight Bearing Restrictions: No    Mobility  Bed Mobility Overal bed mobility: Modified Independent (increased time and rail but able to do independantly) Bed Mobility: Supine to Sit     Supine to sit: Modified independent (Device/Increase time) (inc time and rail but able to do independantly)     Transfers Overall transfer level: Needs assistance Equipment used: Quad cane;1 person hand held assist   Sit to Stand: Min assist         General transfer comment: posterior loss of balances  Ambulation/Gait     Assistive device: Quad cane;1 person hand held Engineer, manufacturing    Modified Rankin (Stroke Patients Only)       Balance     Sitting balance-Leahy Scale: Fair        Standing balance-Leahy Scale: Poor                      Cognition Arousal/Alertness: Awake/alert Behavior During Therapy: WFL for tasks assessed/performed Overall Cognitive Status: Within Functional Limits for tasks assessed                      Exercises      General Comments        Pertinent Vitals/Pain      Home Living                      Prior Function            PT Goals (current goals can now be found in the care plan section)      Frequency  Min 2X/week    PT Plan      Co-evaluation             End of Session   Activity Tolerance: Patient limited by pain Patient left: in chair;with call bell/phone within reach;with chair alarm set;with family/visitor present     Time: 1540-1556 PT Time Calculation (min) (ACUTE ONLY): 16 min  Charges:                       G Codes:  Chesley Noon, PTA 11/29/2015, 4:46 PM

## 2015-11-29 NOTE — Progress Notes (Signed)
CSW met with patient and her niece Red Christians [phone]. CSW presented bed offer. Patient and her niece are unsure of where they'd like for patient to go. Requested more time to review the SNF list. CSW will continue to follow and assist.   Ernest Pine, MSW, [place] Work Department [phone]

## 2015-11-30 LAB — GLUCOSE, CAPILLARY
Glucose-Capillary: 182 mg/dL — ABNORMAL HIGH (ref 65–99)
Glucose-Capillary: 226 mg/dL — ABNORMAL HIGH (ref 65–99)

## 2015-11-30 MED ORDER — OXYCODONE HCL 5 MG PO TABS: 5.0000 mg | ORAL_TABLET | ORAL | Status: DC | PRN

## 2015-11-30 MED ORDER — HYDRALAZINE HCL 25 MG PO TABS: 25.0000 mg | ORAL_TABLET | Freq: Three times a day (TID) | ORAL | Status: DC

## 2015-11-30 MED ORDER — ASPIRIN 81 MG PO CHEW: 81.0000 mg | CHEWABLE_TABLET | Freq: Every day | ORAL | Status: DC

## 2015-11-30 NOTE — Progress Notes (Signed)
Patient and her niece accepted bed offer at [facility]. CSW informed MD of above.   Ernest Pine, MSW, [place] Social Work Department [phone]

## 2015-11-30 NOTE — Discharge Summary (Signed)
Katrina Jefferson, is a 82 y.o. female  DOB September 27, 1924  MRN [id number].  Admission date:  11/27/2015  Admitting Physician  Lance Coon, MD  Discharge Date:  11/30/2015   Primary MD  Glendon Axe, MD  Recommendations for primary care physician for things to follow:   Follow up with PMD in one week   Admission Diagnosis  Right hip pain [M25.551] Right knee pain [M25.561] Lumbar strain, initial encounter [id number] Head injury, initial encounter [S09.90XA] Thoracic myofascial strain, initial encounter [id number]   Discharge Diagnosis  Right hip pain [M25.551] Right knee pain [M25.561] Lumbar strain, initial encounter [id number] Head injury, initial encounter [S09.90XA] Thoracic myofascial strain, initial encounter [id number]    Principal Problem:   Frequent falls Active Problems:   COPD (chronic obstructive pulmonary disease) (HCC)   HLD (hyperlipidemia)   Accelerated hypertension   Falls      Past Medical History  Diagnosis Date  . COPD (chronic obstructive pulmonary disease) ([place])   . Arthritis   . Depression   . Diabetes mellitus without complication ([place])   . Hyperlipidemia   . Insomnia   . Adrenal mass, left ([place])   . Osteoporosis   . Retinal hemorrhage     Past Surgical History  Procedure Laterality Date  . Esophagogastroduodenoscopy (egd) with propofol N/A 09/17/2015    Procedure: ESOPHAGOGASTRODUODENOSCOPY (EGD) WITH PROPOFOL;  Surgeon: Josefine Class, MD;  Location: [facility] ENDOSCOPY;  Service: Endoscopy;  Laterality: N/A;       History of present illness and  Hospital Course:     Kindly see H&P for history of present illness and admission details, please review complete Labs, Consult reports and Test reports for all details in brief  HPI  from the history and physical done on the  day of admission 82 yr old female admitted because of  Frequent falls./uncontrolled HTN.   Hospital Course 1.Frequent falls; due to deconditioning.pt has carotid doppler and ct angio carotid showing around 50% stenosis bilaterally.she has CT head which did not show any stroke.CT rght hip and CT lumbar spine negative for fractures,CT lumbar spine showed degenerative disease.Pt continued on Pain med.PT recommends SNF placement and family chose [facility],continue ASA.statins for bilateral carotid disease,needs ultrasound for carotid every  6 months. 2.copd;stable.no wheezing.continue 02. 3.depression;continue celexa 4.DMII;continue Metformin  Discharge Condition: stable   Follow UP  Follow-up Information    Follow up with Singh,Jasmine, MD. Schedule an appointment as soon as possible for a visit in 3 days.   Specialty:  Internal Medicine   Contact information:   [address] [place] [id number] [phone]       Follow up with [facility] .   Specialty:  [place] information:   [address] [place] [phone]        Discharge Instructions  and  Discharge Medications       Medication List    ASK your doctor about these medications        acetaminophen 650 MG CR tablet  Commonly known as:  TYLENOL  Take 1,300 mg by mouth 2 (two) times daily.     CLARITIN 10 MG Caps  Generic drug:  Loratadine  Take 10 mg by mouth daily.     clobetasol cream 0.05 %  Commonly known as:  TEMOVATE  Apply 1 application topically 2 (two) times daily.     escitalopram 10 MG tablet  Commonly known as:  LEXAPRO  Take 10 mg by mouth daily.     fluticasone 50 MCG/ACT nasal spray  Commonly known as:  FLONASE  Place into both nostrils daily.     HYDROcodone-acetaminophen 5-325 MG tablet  Commonly known as:  NORCO/VICODIN  Take 1 tablet by mouth 2 (two) times daily.     metFORMIN  500 MG tablet  Commonly known as:  GLUCOPHAGE  Take by mouth 2 (two) times daily with a meal.     omeprazole 40 MG capsule  Commonly known as:  PRILOSEC  Take 40 mg by mouth daily.     promethazine 25 MG tablet  Commonly known as:  PHENERGAN  Take 25 mg by mouth at bedtime as needed for nausea or vomiting.     ranitidine 75 MG tablet  Commonly known as:  ZANTAC  Take 75 mg by mouth 2 (two) times daily.     simvastatin 40 MG tablet  Commonly known as:  ZOCOR  Take 40 mg by mouth daily.     temazepam 15 MG capsule  Commonly known as:  RESTORIL  Take 15 mg by mouth at bedtime as needed for sleep.          Diet and Activity recommendation: See Discharge Instructions above   Consults obtained -PT SW   Major procedures and Radiology Reports - PLEASE review detailed and final reports for all details, in brief -      Dg Thoracic Spine 2 View  11/28/2015  CLINICAL DATA:  Status post fall in kitchen, with upper back pain. Initial encounter. EXAM: THORACIC SPINE 2 VIEWS COMPARISON:  CT of the chest performed 09/18/2015 FINDINGS: There is no evidence of fracture or subluxation along the thoracic spine. Thoracic vertebral bodies demonstrate normal height and alignment. Intervertebral disc spaces are preserved. There is grade 2 anterolisthesis of C4 on C5, and grade 1 anterolisthesis of C5 on C6, reflecting underlying facet disease. The visualized portions of both lungs are clear. The mediastinum is unremarkable in appearance. IMPRESSION: 1. No evidence of fracture or subluxation along the thoracic spine. 2. Mild degenerative change along the cervical spine, as described above. Electronically Signed   By: Garald Balding M.D.   On: 11/28/2015 00:04   Dg Lumbar Spine Complete  11/28/2015  CLINICAL DATA:  Status post fall in kitchen, with lower back pain. Initial encounter. EXAM: LUMBAR SPINE - COMPLETE 4+ VIEW COMPARISON:  CT of the abdomen and pelvis performed 09/18/2015 FINDINGS: There  is no evidence of acute fracture or subluxation. Vertebral bodies demonstrate normal height. There is mild grade 1 retrolisthesis of L1 on L2 and of L2 on L3, with mild multilevel disc space narrowing along the lumbar spine, and underlying facet disease. The visualized bowel gas pattern is unremarkable in appearance; air and stool are noted within the colon. The sacroiliac joints are within normal limits. IMPRESSION: 1. No evidence of fracture or subluxation along the lumbar spine. 2. Mild diffuse degenerative change along the lumbar spine. Electronically Signed   By: Garald Balding M.D.   On: 11/28/2015 00:08   Dg Wrist Complete Right  11/15/2015  CLINICAL DATA:  Pain following fall 2 days prior EXAM: RIGHT WRIST - COMPLETE 3+ VIEW COMPARISON:  None. FINDINGS: Frontal, oblique, lateral, and ulnar deviation scaphoid images were obtained. There is soft tissue swelling in a generalized manner. Bones are somewhat osteoporotic. There is a subtle transverse lucency in the distal radial metaphysis, best seen on the ulnar deviation scaphoid image. In this region on the lateral view,  there is questionable equivocal cortical disruption dorsally. This finding is concerning for a nondisplaced fracture of the distal radial metaphysis. There is no other evidence of potential fracture. No dislocation. There is advanced osteoarthritic change in the first carpal -metacarpal joint. There is moderate osteoarthritic change in the scaphotrapezial joint. There is calcification in the triangular fibrocartilage. IMPRESSION: Subtle linear lucency in the distal radial metaphysis with underlying osteoporosis. A nondisplaced fracture of the distal radius is of concern given this appearance. No other evidence of potential fracture. Advanced osteoarthritis in the first carpal -metacarpal joint. Calcification in the triangular fibrocartilage is concerning for potential chronic triangular fibrocartilage tear. There is generalized soft tissue  swelling. Electronically Signed   By: Lowella Grip III M.D.   On: 11/15/2015 13:15   Ct Head Wo Contrast  11/27/2015  CLINICAL DATA:  Fall in kitchen sustaining head injury. Posterior hematoma after striking head. EXAM: CT HEAD WITHOUT CONTRAST TECHNIQUE: Contiguous axial images were obtained from the base of the skull through the vertex without intravenous contrast. COMPARISON:  None. FINDINGS: No intracranial hemorrhage, mass effect, or midline shift. Age-related atrophy and chronic small vessel ischemia. Small lacunar infarct in the periventricular right frontal lobe. No hydrocephalus. The basilar cisterns are patent. No evidence of territorial infarct. No intracranial fluid collection. Calvarium is intact. Included paranasal sinuses and mastoid air cells are well aerated. IMPRESSION: 1.  No acute intracranial abnormality. 2. Age related atrophy and chronic small vessel ischemia. Electronically Signed   By: Jeb Levering M.D.   On: 11/27/2015 23:27   Ct Angio Neck W/cm &/or Wo/cm  11/28/2015  CLINICAL DATA:  Dizziness. EXAM: CT ANGIOGRAPHY NECK TECHNIQUE: Multidetector CT imaging of the neck was performed using the standard protocol during bolus administration of intravenous contrast. Multiplanar CT image reconstructions and MIPs were obtained to evaluate the vascular anatomy. Carotid stenosis measurements (when applicable) are obtained utilizing NASCET criteria, using the distal internal carotid diameter as the denominator. CONTRAST:  144mL OMNIPAQUE IOHEXOL 350 MG/ML SOLN COMPARISON:  Carotid Doppler from earlier today FINDINGS: Aortic arch: Atherosclerosis without visualized aneurysm or dissection. Three vessel branching. Right carotid system: Ostium partly obscured by intravenous contrast and streak. No stenosis at this level is suspected. There is calcified atherosclerosis at the bifurcation with downstream noncalcified proximal ICA plaque. Maximal stenosis measures ~50%. No evidence of  dissection or plaque ulceration. Retropharyngeal ICA course. Left carotid system: Prominent calcified plaque at the bifurcation with ~50% stenosis. Moderate proximale ECA narrowing. No evidence of dissection or plaque ulceration. Retropharyngeal ICA course. Vertebral arteries:Bilateral proximal subclavian arteries show no flow limiting stenosis. Strong left dominance. No occlusion or stenosis. Skeleton: Severe multilevel right-sided facet arthropathy with C4-5 anterolisthesis and milder C5-6 anterolisthesis. No evidence of acute fracture. Other neck: Biapical centrilobular emphysema 24 mm right thyroid mass without demonstrable change since cervical spine MRI in 2005. Given stability and patient age this is considered incidental. IMPRESSION: 1. Atherosclerosis with bilateral ~50% proximal ICA stenosis. 2. Non stenotic bilateral vertebral arteries. Electronically Signed   By: Monte Fantasia M.D.   On: 11/28/2015 16:32   Ct Lumbar Spine Wo Contrast  11/28/2015  CLINICAL DATA:  Status post fall onto ground, with lower back pain. Initial encounter. EXAM: CT LUMBAR SPINE WITHOUT CONTRAST TECHNIQUE: Multidetector CT imaging of the lumbar spine was performed without intravenous contrast administration. Multiplanar CT image reconstructions were also generated. COMPARISON:  Lumbar spine radiographs performed 11/27/2015, and CT of the abdomen and pelvis performed 05/17/2014 FINDINGS: There is no evidence of acute fracture  or subluxation along the lumbar spine. There is mild grade 1 retrolisthesis of L1 on L2. Multilevel vacuum phenomenon is noted along the lumbar spine. Scattered anterior and posterior osteophytes are noted along the lumbar spine. Underlying facet disease is noted. Broad-based disc protrusions are noted L1-L2, L3-L4, L4-L5 and L5-S1, with mild associated foraminal narrowing at the lower lumbar spine but no definite evidence of impression on exiting nerve roots. Scattered calcification is noted along the  abdominal aorta and its branches. A nonobstructing 8 mm stone is noted at the lower pole of the left kidney. Scattered diverticulosis is noted along the visualized sigmoid colon. There is mild atrophy involving the paraspinal musculature. Minimal bibasilar airspace opacities may reflect atelectasis or possibly mild infection. IMPRESSION: 1. No evidence of acute fracture or subluxation along the lumbar spine. 2. Mild degenerative change along the lumbar spine, as described above. Multilevel foraminal narrowing along the lower lumbar spine, but no definite evidence of impression on exiting nerve roots. 3. Scattered calcification along the abdominal aorta and its branches. 4. Nonobstructing 8 mm stone at the lower pole of the left kidney. 5. Scattered diverticulosis along the visualized sigmoid colon. 6. Minimal bibasilar airspace opacities may reflect atelectasis or possibly mild infection. Electronically Signed   By: Garald Balding M.D.   On: 11/28/2015 02:43   Ct Hip Right Wo Contrast  11/28/2015  CLINICAL DATA:  Right hip pain after fall in kitchen. EXAM: CT OF THE RIGHT HIP WITHOUT CONTRAST TECHNIQUE: Multidetector CT imaging of the right hip was performed according to the standard protocol. Multiplanar CT image reconstructions were also generated. COMPARISON:  Radiographs 4 hours prior. FINDINGS: No fracture. Femoral head is seated in the acetabulum. Pubic rami are intact. Age related degenerative change and mild chondrocalcinosis. Pubic symphysis is congruent. No confluent soft tissue hematoma. IMPRESSION: No fracture or subluxation of the right hip. Electronically Signed   By: Jeb Levering M.D.   On: 11/28/2015 02:41   US Carotid Bilateral  11/28/2015  CLINICAL DATA:  Fall.  Hypertension. EXAM: BILATERAL CAROTID DUPLEX ULTRASOUND TECHNIQUE: Pearline Cables scale imaging, color Doppler and duplex ultrasound were performed of bilateral carotid and vertebral arteries in the neck. COMPARISON:  CT 11/27/2015.  MRI  11/02/2008. FINDINGS: Criteria: Quantification of carotid stenosis is based on velocity parameters that correlate the residual internal carotid diameter with NASCET-based stenosis levels, using the diameter of the distal internal carotid lumen as the denominator for stenosis measurement. The following velocity measurements were obtained: RIGHT ICA:  174/47 cm/sec CCA:  [id number] cm/sec SYSTOLIC ICA/CCA RATIO:  1.3 DIASTOLIC ICA/CCA RATIO:  3.7 ECA:  175 cm/sec LEFT ICA:  182/41 cm/sec CCA:  [id number] cm/sec SYSTOLIC ICA/CCA RATIO:  1.1 DIASTOLIC ICA/CCA RATIO:  1.8 ECA:  233 cm/sec RIGHT CAROTID ARTERY: Calcified right carotid bifurcation and proximal ICA plaque. Elevated flow velocities. RIGHT VERTEBRAL ARTERY:  Patent antegrade flow. LEFT CAROTID ARTERY: Calcified left carotid bifurcation and proximal ICA plaque. Elevated flow velocities. LEFT VERTEBRAL ARTERY:  Patent antegrade flow. IMPRESSION: 1. Bilateral calcified carotid bifurcation proximal ICA atherosclerotic vascular plaque. Degree of stenosis in the 50-69% range bilaterally. 2. Vertebral arteries are patent antegrade flow. Electronically Signed   By: Marcello Moores  Register   On: 11/28/2015 10:21   Dg Knee Complete 4 Views Right  11/28/2015  CLINICAL DATA:  Fall in [employer], now with right knee pain. EXAM: RIGHT KNEE - COMPLETE 4+ VIEW COMPARISON:  None. FINDINGS: No fracture or dislocation. Mild medial tibial femoral joint space narrowing. Lateral meniscal chondrocalcinosis. No  joint effusion. Quadriceps enthesophyte. IMPRESSION: 1. No acute fracture dislocation. 2. Mild for age osteoarthritis and chondrocalcinosis. Electronically Signed   By: Jeb Levering M.D.   On: 11/28/2015 00:09   Dg Hip Unilat With Pelvis 2-3 Views Right  11/28/2015  CLINICAL DATA:  Fall in [employer], now with right hip pain. EXAM: DG HIP (WITH OR WITHOUT PELVIS) 2-3V RIGHT COMPARISON:  None. FINDINGS: The cortical margins of the bony pelvis are intact. No fracture.  Pubic symphysis and sacroiliac joints are congruent. Both femoral heads are well-seated in the respective acetabula. Age-related degenerative change of both hips, left greater than right. Bones under mineralized. IMPRESSION: No fracture or dislocation of the pelvis or right hip. Electronically Signed   By: Jeb Levering M.D.   On: 11/28/2015 00:07    Micro Results    No results found for this or any previous visit (from the past 240 hour(s)).     Today   Subjective:   Katrina Jefferson today has no headache,ha some back pain,stable for rehab.  Objective:   Blood pressure 162/50, pulse 107, temperature 97.9 F (36.6 C), temperature source Oral, resp. rate 20, height 5\' 3"  (1.6 m), weight 65.908 kg (145 lb 4.8 oz), SpO2 91 %.   Intake/Output Summary (Last 24 hours) at 11/30/15 1450 Last data filed at 11/30/15 1254  Gross per 24 hour  Intake    123 ml  Output    850 ml  Net   -727 ml    Exam Awake Alert, Oriented x 3, No new F.N deficits, Normal affect [place].AT,PERRAL Supple Neck,No JVD, No cervical lymphadenopathy appriciated.  Symmetrical Chest wall movement, Good air movement bilaterally, CTAB RRR,No Gallops,Rubs or new Murmurs, No Parasternal Heave +ve B.Sounds, Abd Soft, Non tender, No organomegaly appriciated, No rebound -guarding or rigidity. No Cyanosis, Clubbing or edema, No new Rash or bruise  Data Review   CBC w Diff: Lab Results  Component Value Date   WBC 5.1 11/27/2015   WBC 12.4* 05/18/2014   HGB 12.4 11/27/2015   HGB 13.3 05/18/2014   HCT 37.5 11/27/2015   HCT 39.9 05/18/2014   PLT 227 11/27/2015   PLT 173 05/18/2014   LYMPHOPCT 4.1 05/18/2014   MONOPCT 4.8 05/18/2014   EOSPCT 0.0 05/18/2014   BASOPCT 0.3 05/18/2014    CMP: Lab Results  Component Value Date   NA 138 11/27/2015   NA 141 05/17/2014   K 3.6 11/27/2015   K 4.0 05/17/2014   CL 106 11/27/2015   CL 106 05/17/2014   CO2 25 11/27/2015   CO2 24 05/17/2014   BUN 20 11/27/2015    BUN 18 05/17/2014   CREATININE 0.45 11/27/2015   CREATININE 0.73 05/17/2014   PROT 7.9 09/18/2015   PROT 7.9 05/17/2014   ALBUMIN 4.2 09/18/2015   ALBUMIN 3.5 05/17/2014   BILITOT 0.4 09/18/2015   BILITOT 0.7 05/17/2014   ALKPHOS 69 09/18/2015   ALKPHOS 73 05/17/2014   AST 26 09/18/2015   AST 27 05/17/2014   ALT 20 09/18/2015   ALT 22 05/17/2014  .   Total Time in preparing paper work, data evaluation and todays exam - 65 minutes  Ewelina Naves M.D on 11/30/2015 at 2:50 PM    Note: This dictation was prepared with Dragon dictation along with smaller phrase technology. Any transcriptional errors that result from this process are unintentional.

## 2015-11-30 NOTE — Progress Notes (Signed)
Clinical Social Worker was informed that patient will be medically ready to discharge to [facility]. Patient and her niece are in a agreement with plan. CSW called Broadus John- admissions coordinator at [facility] to confirm that patient's bed is ready. Provided patient's room number 808 and number to call for report . All discharge information faxed to [facility] via [facility]. Rx's and DNR added to discharge packet.   RN will call report and patient will discharge to [facility] via [place], MSW, [place] Work Department [phone]

## 2015-11-30 NOTE — Progress Notes (Signed)
EMS here to transport pt to [facility] 

## 2015-11-30 NOTE — Progress Notes (Signed)
[facility] [place] NAME: Katrina Jefferson    MR#:  [id number]  DATE OF BIRTH:  21-Sep-1924  SUBJECTIVE: Complains of back pain. No other complaints.   CHIstill is negative EF COMPLAINT:   Chief Complaint  Patient presents with  . Fall    REVIEW OF SYSTEMS:    Review of Systems  Constitutional: Negative for fever and chills.  HENT: Negative for hearing loss.   Eyes: Negative for blurred vision, double vision and photophobia.  Respiratory: Negative for cough, hemoptysis and shortness of breath.   Cardiovascular: Negative for palpitations, orthopnea and leg swelling.  Gastrointestinal: Negative for vomiting, abdominal pain and diarrhea.  Genitourinary: Negative for dysuria and urgency.  Musculoskeletal: Positive for back pain, joint pain and falls. Negative for myalgias and neck pain.  Skin: Negative for rash.  Neurological: Negative for dizziness, focal weakness, seizures, weakness and headaches.  Psychiatric/Behavioral: Negative for memory loss. The patient does not have insomnia.     Nutrition:  Tolerating Diet: Tolerating PT:      DRUG ALLERGIES:   Allergies  Allergen Reactions  . Codeine Sulfate   . Erythromycin   . Sulfa Antibiotics   . Ultracet [Tramadol-Acetaminophen]     VITALS:  Blood pressure 162/50, pulse 107, temperature 97.9 F (36.6 C), temperature source Oral, resp. rate 20, height 5\' 3"  (1.6 m), weight 65.908 kg (145 lb 4.8 oz), SpO2 91 %.  PHYSICAL EXAMINATION:   Physical Exam  GENERAL:  82 y.o.-year-old patient lying in the bed with no acute distress.  EYES: Pupils equal, round, reactive to light and accommodation. No scleral icterus. Extraocular muscles intact.  HEENT: Head atraumatic, normocephalic. Oropharynx and nasopharynx clear.  NECK:  Supple, no jugular venous distention. No thyroid enlargement, no tenderness.  LUNGS: Normal breath sounds bilaterally, no wheezing, rales,rhonchi or  crepitation. No use of accessory muscles of respiration.  CARDIOVASCULAR: S1, S2 normal. No murmurs, rubs, or gallops.  ABDOMEN: Soft, nontender, nondistended. Bowel sounds present. No organomegaly or mass.  EXTREMITIES: No pedal edema, cyanosis, or clubbing.  NEUROLOGIC: Cranial nerves II through XII are intact. Muscle strength 5/5 in all extremities. Sensation intact. Gait not checked.  PSYCHIATRIC: The patient is alert and oriented x 3.  SKIN: No obvious rash, lesion, or ulcer.    LABORATORY PANEL:   CBC  Recent Labs Lab 11/27/15 2233  WBC 5.1  HGB 12.4  HCT 37.5  PLT 227   ------------------------------------------------------------------------------------------------------------------  Chemistries   Recent Labs Lab 11/27/15 2233  NA 138  K 3.6  CL 106  CO2 25  GLUCOSE 184*  BUN 20  CREATININE 0.45  CALCIUM 9.2   ------------------------------------------------------------------------------------------------------------------  Cardiac Enzymes  Recent Labs Lab 11/27/15 2233  TROPONINI 0.03   ------------------------------------------------------------------------------------------------------------------  RADIOLOGY:  Ct Angio Neck W/cm &/or Wo/cm  11/28/2015  CLINICAL DATA:  Dizziness. EXAM: CT ANGIOGRAPHY NECK TECHNIQUE: Multidetector CT imaging of the neck was performed using the standard protocol during bolus administration of intravenous contrast. Multiplanar CT image reconstructions and MIPs were obtained to evaluate the vascular anatomy. Carotid stenosis measurements (when applicable) are obtained utilizing NASCET criteria, using the distal internal carotid diameter as the denominator. CONTRAST:  116mL OMNIPAQUE IOHEXOL 350 MG/ML SOLN COMPARISON:  Carotid Doppler from earlier today FINDINGS: Aortic arch: Atherosclerosis without visualized aneurysm or dissection. Three vessel branching. Right carotid system: Ostium partly obscured by intravenous contrast and  streak. No stenosis at this level is suspected. There is calcified atherosclerosis at the bifurcation with downstream  noncalcified proximal ICA plaque. Maximal stenosis measures ~50%. No evidence of dissection or plaque ulceration. Retropharyngeal ICA course. Left carotid system: Prominent calcified plaque at the bifurcation with ~50% stenosis. Moderate proximale ECA narrowing. No evidence of dissection or plaque ulceration. Retropharyngeal ICA course. Vertebral arteries:Bilateral proximal subclavian arteries show no flow limiting stenosis. Strong left dominance. No occlusion or stenosis. Skeleton: Severe multilevel right-sided facet arthropathy with C4-5 anterolisthesis and milder C5-6 anterolisthesis. No evidence of acute fracture. Other neck: Biapical centrilobular emphysema 24 mm right thyroid mass without demonstrable change since cervical spine MRI in 2005. Given stability and patient age this is considered incidental. IMPRESSION: 1. Atherosclerosis with bilateral ~50% proximal ICA stenosis. 2. Non stenotic bilateral vertebral arteries. Electronically Signed   By: Monte Fantasia M.D.   On: 11/28/2015 16:32     ASSESSMENT AND PLAN:   Principal Problem:   Frequent falls Active Problems:   COPD (chronic obstructive pulmonary disease) (HCC)   HLD (hyperlipidemia)   Accelerated hypertension   Falls   1. Frequent falls secondary to dizziness: Patient workup including showed no fracture in the knee thoracic or lumbar areas. Patient has a right distal radius fracture by previous x-ray of the right wrist on 9 February so she has  Splint. physical therapy recommends SNF placement , patient is requesting  to  Go to [facility]  2. dizzinessSecondary to bilateral carotid disease, moderate disease: Start on aspirin. Statins. the catheter just shows bilateral bases but around  50% Stenosis Only  #3. Multiple falls physical therapy consult. Has lots of back pain issues and in U oxycodone for pain  control. #4 uncontrolled hypertension: Patient has no evidence of high blood pressure at home. Echocardiogram, start her on hydralazine 25 mg 3 times a day.,EF 65% disposition ;physical therapy consult ;recommends SNF Pt medically ready for discharge  When arrangements are made,  All the records are reviewed and case discussed with Care Management/Social Workerr. Management plans discussed with the patient, family and they are in agreement.  CODE STATUS: DO NOT RESUSCITATE  TOTAL TIME TAKING CARE OF THIS PATIENT: 66minutes.   POSSIBLE D/C IN 1-2 DAYS, DEPENDING ON CLINICAL CONDITION.   Epifanio Lesches M.D on 11/30/2015 at 11:53 AM  Between 7am to 6pm - Pager - [phone]  After 6pm go to [email or web address] - password [email or web address] [facility]  Office  [phone]  CC: Primary care physician; Glendon Axe, MD

## 2015-11-30 NOTE — Plan of Care (Signed)
Problem: Safety: Goal: Ability to remain free from injury will improve Outcome: Progressing Fall precautions in place  Problem: Pain Managment: Goal: General experience of comfort will improve Outcome: Progressing Prn medications  Problem: Activity: Goal: Risk for activity intolerance will decrease Outcome: Not Progressing PT working with pt

## 2015-11-30 NOTE — Clinical Social Work Placement (Signed)
   CLINICAL SOCIAL WORK PLACEMENT  NOTE  Date:  11/30/2015  Patient Details  Name: [name] MRN: [id number] Date of Birth: 11/27/1923  Clinical Social Work is seeking post-discharge placement for this patient at the [place] level of care (*CSW will initial, date and re-position this form in  chart as items are completed):  Yes   Patient/family provided with [place] Work Department's list of facilities offering this level of care within the geographic area requested by the patient (or if unable, by the patient's family).  Yes   Patient/family informed of their freedom to choose among providers that offer the needed level of care, that participate in Medicare, Medicaid or managed care program needed by the patient, have an available bed and are willing to accept the patient.  Yes   Patient/family informed of [facility]'s ownership interest in [facility] and [facility], as well as of the fact that they are under no obligation to receive care at these facilities.  PASRR submitted to EDS on 11/29/15     PASRR number received on 11/29/15     Existing PASRR number confirmed on       FL2 transmitted to all facilities in geographic area requested by pt/family on 11/29/15     FL2 transmitted to all facilities within larger geographic area on       Patient informed that his/her managed care company has contracts with or will negotiate with certain facilities, including the following:        Yes   Patient/family informed of bed offers received.  [facility]     Physician recommends and patient chooses bed at      Patient to be transferred to  ([facility] on 11/30/15.  Patient to be transferred to facility by  [facility])     Patient family notified on 11/30/15 of transfer.  Name of family member notified:   [name]- Niece )     PHYSICIAN       Additional Comment:     _______________________________________________ Baldemar Lenis, LCSW 11/30/2015, 12:58 PM

## 2015-11-30 NOTE — Progress Notes (Signed)
EMS called re non emergent transport to [facility]. 

## 2015-11-30 NOTE — Progress Notes (Signed)
Report called to Yaakov Guthrie, RN at [facility]

## 2016-12-31 ENCOUNTER — Emergency Department
Admission: EM | Admit: 2016-12-31 | Discharge: 2016-12-31 | Disposition: A | Discharge status: Home / Self Care | Payer: Medicare Other | Attending: Emergency Medicine | Admitting: Emergency Medicine

## 2016-12-31 ENCOUNTER — Encounter: Payer: Self-pay | Admitting: Emergency Medicine

## 2016-12-31 DIAGNOSIS — Z79899 Other long term (current) drug therapy: Secondary | ICD-10-CM | POA: Diagnosis not present

## 2016-12-31 DIAGNOSIS — I10 Essential (primary) hypertension: Secondary | ICD-10-CM | POA: Insufficient documentation

## 2016-12-31 DIAGNOSIS — R3121 Asymptomatic microscopic hematuria: Secondary | ICD-10-CM | POA: Diagnosis not present

## 2016-12-31 DIAGNOSIS — R030 Elevated blood-pressure reading, without diagnosis of hypertension: Secondary | ICD-10-CM

## 2016-12-31 DIAGNOSIS — F17200 Nicotine dependence, unspecified, uncomplicated: Secondary | ICD-10-CM | POA: Diagnosis not present

## 2016-12-31 DIAGNOSIS — E119 Type 2 diabetes mellitus without complications: Secondary | ICD-10-CM | POA: Diagnosis not present

## 2016-12-31 DIAGNOSIS — J449 Chronic obstructive pulmonary disease, unspecified: Secondary | ICD-10-CM | POA: Diagnosis not present

## 2016-12-31 DIAGNOSIS — Z7982 Long term (current) use of aspirin: Secondary | ICD-10-CM | POA: Diagnosis not present

## 2016-12-31 LAB — URINALYSIS, COMPLETE (UACMP) WITH MICROSCOPIC
BACTERIA UA: NONE SEEN
Glucose, UA: 50 mg/dL — AB
KETONES UR: 5 mg/dL — AB
LEUKOCYTES UA: NEGATIVE
NITRITE: NEGATIVE
PH: 5 (ref 5.0–8.0)
PROTEIN: 100 mg/dL — AB
Specific Gravity, Urine: 1.029 (ref 1.005–1.030)

## 2016-12-31 LAB — CBC WITH DIFFERENTIAL/PLATELET
Basophils Absolute: 0 10*3/uL (ref 0–0.1)
Basophils Relative: 1 %
EOS ABS: 0.1 10*3/uL (ref 0–0.7)
EOS PCT: 1 %
HCT: 34.6 % — ABNORMAL LOW (ref 35.0–47.0)
Hemoglobin: 11.5 g/dL — ABNORMAL LOW (ref 12.0–16.0)
LYMPHS ABS: 1.4 10*3/uL (ref 1.0–3.6)
LYMPHS PCT: 28 %
MCH: 29.5 pg (ref 26.0–34.0)
MCHC: 33.3 g/dL (ref 32.0–36.0)
MCV: 88.6 fL (ref 80.0–100.0)
MONO ABS: 0.5 10*3/uL (ref 0.2–0.9)
Monocytes Relative: 10 %
Neutro Abs: 3.1 10*3/uL (ref 1.4–6.5)
Neutrophils Relative %: 60 %
PLATELETS: 246 10*3/uL (ref 150–440)
RBC: 3.9 MIL/uL (ref 3.80–5.20)
RDW: 15.5 % — AB (ref 11.5–14.5)
WBC: 5.2 10*3/uL (ref 3.6–11.0)

## 2016-12-31 LAB — COMPREHENSIVE METABOLIC PANEL
ALT: 16 U/L (ref 14–54)
ANION GAP: 7 (ref 5–15)
AST: 27 U/L (ref 15–41)
Albumin: 3.6 g/dL (ref 3.5–5.0)
Alkaline Phosphatase: 51 U/L (ref 38–126)
BUN: 19 mg/dL (ref 6–20)
CHLORIDE: 106 mmol/L (ref 101–111)
CO2: 26 mmol/L (ref 22–32)
Calcium: 9.2 mg/dL (ref 8.9–10.3)
Creatinine, Ser: 0.43 mg/dL — ABNORMAL LOW (ref 0.44–1.00)
Glucose, Bld: 135 mg/dL — ABNORMAL HIGH (ref 65–99)
POTASSIUM: 3.2 mmol/L — AB (ref 3.5–5.1)
SODIUM: 139 mmol/L (ref 135–145)
Total Bilirubin: 0.4 mg/dL (ref 0.3–1.2)
Total Protein: 7.2 g/dL (ref 6.5–8.1)

## 2016-12-31 LAB — TROPONIN I: Troponin I: <0.03 ng/mL (ref <0.03)

## 2016-12-31 NOTE — ED Triage Notes (Signed)
Pt to ED via POV with c/o HTN xfew days, states BP was 217/105 PTA. Pt BP 167/69 at this time. Denies any use of HTN meds. Pt denies pain. Pt A&OX4, NAD noted.

## 2016-12-31 NOTE — ED Provider Notes (Signed)
[facility] Emergency Department Provider Note       Time seen: ----------------------------------------- 2:43 PM on 12/31/2016 -----------------------------------------     I have reviewed the triage vital signs and the nursing notes.   HISTORY   Chief Complaint Hypertension    HPI Katrina Jefferson is a 83 y.o. female who presents to the ED for hypertension for several days. Patient reports blood pressure was 217/105 prior to arrival. Blood pressure was 167/69 at this time. She denies any history of hypertension or antihypertensive medications. She denies pain or recent illness. She has no complaints at this time, her only concern is her blood pressure.   Past Medical History:  Diagnosis Date  . Adrenal mass, left ([place])   . Arthritis   . COPD (chronic obstructive pulmonary disease) ([place])   . Depression   . Diabetes mellitus without complication ([place])   . Hyperlipidemia   . Insomnia   . Osteoporosis   . Retinal hemorrhage     Patient Active Problem List   Diagnosis Date Noted  . Frequent falls 11/28/2015  . COPD (chronic obstructive pulmonary disease) ([place]) 11/28/2015  . HLD (hyperlipidemia) 11/28/2015  . Accelerated hypertension 11/28/2015  . Falls 11/28/2015    Past Surgical History:  Procedure Laterality Date  . ESOPHAGOGASTRODUODENOSCOPY (EGD) WITH PROPOFOL N/A 09/17/2015   Procedure: ESOPHAGOGASTRODUODENOSCOPY (EGD) WITH PROPOFOL;  Surgeon: Josefine Class, MD;  Location: [facility] ENDOSCOPY;  Service: Endoscopy;  Laterality: N/A;    Allergies Codeine sulfate; Erythromycin; Sulfa antibiotics; and Ultracet [tramadol-acetaminophen]  Social History Social History  Substance Use Topics  . Smoking status: Current Every Day Smoker    Packs/day: 0.50  . Smokeless tobacco: Never Used  . Alcohol use No    Review of Systems Constitutional: Negative for fever. Cardiovascular: Negative for chest pain. Respiratory: Negative for  shortness of breath. Gastrointestinal: Negative for abdominal pain, vomiting and diarrhea. Genitourinary: Negative for dysuria. Musculoskeletal: Negative for back pain. Skin: Negative for rash. Neurological: Negative for headaches, focal weakness or numbness.  10-point ROS otherwise negative.  ____________________________________________   PHYSICAL EXAM:  VITAL SIGNS: ED Triage Vitals  Enc Vitals Group     BP 12/31/16 1401 (!) 167/79     Pulse Rate 12/31/16 1401 94     Resp 12/31/16 1401 16     Temp 12/31/16 1401 98 F (36.7 C)     Temp Source 12/31/16 1401 Oral     SpO2 12/31/16 1401 95 %     Weight 12/31/16 1402 153 lb (69.4 kg)     Height 12/31/16 1402 5' (1.524 m)     Head Circumference --      [facility] --      Pain Score 12/31/16 1401 0     Pain Loc --      Pain Edu? --      Excl. in [place]? --     Constitutional: Alert and oriented. Well appearing and in no distress. Eyes: Conjunctivae are normal. PERRL. Normal extraocular movements. ENT   Head: Normocephalic and atraumatic.   Nose: No congestion/rhinnorhea.   Mouth/Throat: Mucous membranes are moist.   Neck: No stridor. Cardiovascular: Normal rate, regular rhythm. No murmurs, rubs, or gallops. Respiratory: Normal respiratory effort without tachypnea nor retractions. Breath sounds are clear and equal bilaterally. No wheezes/rales/rhonchi. Gastrointestinal: Soft and nontender. Normal bowel sounds Musculoskeletal: Nontender with normal range of motion in extremities. No lower extremity tenderness nor edema. Neurologic:  Normal speech and language. No gross focal neurologic deficits are appreciated.  Skin:  Skin is warm, dry and intact. No rash noted. Psychiatric: Mood and affect are normal. Speech and behavior are normal.  ____________________________________________  EKG: Interpreted by me.Sinus rhythm rate of 92 bpm, normal PR interval, normal QT, possible septal infarct age  indeterminate.  ____________________________________________  ED COURSE:  Pertinent labs & imaging results that were available during my care of the patient were reviewed by me and considered in my medical decision making (see chart for details). Patient presents for hypertension, we will assess with labs and imaging as indicated.   Procedures ____________________________________________   LABS (pertinent positives/negatives)  Labs Reviewed  CBC WITH DIFFERENTIAL/PLATELET - Abnormal; Notable for the following:       Result Value   Hemoglobin 11.5 (*)    HCT 34.6 (*)    RDW 15.5 (*)    All other components within normal limits  COMPREHENSIVE METABOLIC PANEL - Abnormal; Notable for the following:    Potassium 3.2 (*)    Glucose, Bld 135 (*)    Creatinine, Ser 0.43 (*)    All other components within normal limits  URINALYSIS, COMPLETE (UACMP) WITH MICROSCOPIC - Abnormal; Notable for the following:    Color, Urine YELLOW (*)    APPearance HAZY (*)    Glucose, UA 50 (*)    Hgb urine dipstick SMALL (*)    Bilirubin Urine MODERATE (*)    Ketones, ur 5 (*)    Protein, ur 100 (*)    Squamous Epithelial / LPF 0-5 (*)    All other components within normal limits  URINE CULTURE  TROPONIN I  ____________________________________________  FINAL ASSESSMENT AND PLAN  Hypertension, Hematuria  Plan: Patient's labs and imaging were dictated above. Patient had presented for Hypertension. Labs are reassuring except for hematuria. I will refer her to a urologist for outpatient follow-up. Blood pressure had dropped over 50 points without any treatment and likely indicates no treatment is necessary.   [name], MD   Note: This note was generated in part or whole with voice recognition software. Voice recognition is usually quite accurate but there are transcription errors that can and very often do occur. I apologize for any typographical errors that were not detected and  corrected.     [name], MD 12/31/16 [phone]

## 2016-12-31 NOTE — ED Notes (Signed)
Pt discharged home after verbalizing understanding of discharge instructions; nad noted. 

## 2017-01-03 LAB — URINE CULTURE: SPECIAL REQUESTS: NORMAL

## 2017-01-22 ENCOUNTER — Encounter: Payer: Self-pay | Admitting: Urology

## 2017-01-22 ENCOUNTER — Ambulatory Visit: Payer: Medicare Other | Admitting: Urology

## 2017-01-22 VITALS — BP 185/77 | HR 97 | Ht 60.0 in | Wt 134.1 lb

## 2017-01-22 DIAGNOSIS — R3129 Other microscopic hematuria: Secondary | ICD-10-CM | POA: Diagnosis not present

## 2017-01-22 LAB — URINALYSIS, COMPLETE
Bilirubin, UA: NEGATIVE
Leukocytes, UA: NEGATIVE
Nitrite, UA: NEGATIVE
PH UA: 5.5 (ref 5.0–7.5)
RBC, UA: NEGATIVE
Specific Gravity, UA: 1.02 (ref 1.005–1.030)
Urobilinogen, Ur: 0.2 mg/dL (ref 0.2–1.0)

## 2017-01-22 LAB — MICROSCOPIC EXAMINATION: RBC MICROSCOPIC, UA: NONE SEEN /HPF (ref 0–?)

## 2017-01-22 NOTE — Progress Notes (Signed)
01/22/2017 3:29 PM   Katrina Jefferson July 20, 1924 [id number]  Referring provider: Glendon Axe, MD [facility] [place], [place] [address]  Chief Complaint  Patient presents with  . New Patient (Initial Visit)    hematuria referred by Dr. Jimmye Norman    HPI: Patient is a 83 -year-old Caucasian who presents today as a referral from their PCP, Dr. Glendon Axe, for microscopic hematuria with her niece, [name].    Patient was found to have microscopic hematuria on 12/31/2016 with TNTC RBC's/hpf.  Patient doesn't have a prior history of microscopic hematuria.  She does not have a prior history of recurrent urinary tract infections, nephrolithiasis, trauma to the genitourinary tract or malignancies of the genitourinary tract.  She does not have a family medical history of nephrolithiasis, malignancies of the genitourinary tract or hematuria.   Today, she is having/not having symptoms of frequent urination, urgency, dysuria, nocturia and incontinence.  Her UA today demonstrates no hematuria.   She is not experiencing any suprapubic pain, abdominal pain or flank pain. She denies any recent fevers, chills, nausea or vomiting.   Contrast CT of the abdomen performed on 09/18/2015 noted stable left adrenal gland nodule most consistent with a benign adenoma. The right adrenal gland is normal. There are small renal cysts. No worrisome renal lesions. A lower pole left renal calculus is noted.  I have independently reviewed the films.  She is a smoker.  She has not worked with Sports administrator, trichloroethylene, etc.   She has HTN.   PMH: Past Medical History:  Diagnosis Date  . Adrenal mass, left ([place])   . Arthritis   . COPD (chronic obstructive pulmonary disease) ([place])   . Depression   . Diabetes mellitus without complication ([place])   . Hyperlipidemia   . Insomnia   . Osteoporosis   . Retinal hemorrhage     Surgical History: Past Surgical History:    Procedure Laterality Date  . APPENDECTOMY    . BACK SURGERY    . CATARACT EXTRACTION, BILATERAL    . ESOPHAGOGASTRODUODENOSCOPY (EGD) WITH PROPOFOL N/A 09/17/2015   Procedure: ESOPHAGOGASTRODUODENOSCOPY (EGD) WITH PROPOFOL;  Surgeon: Josefine Class, MD;  Location: [facility] ENDOSCOPY;  Service: Endoscopy;  Laterality: N/A;  . GALLBLADDER SURGERY    . TONSILLECTOMY      Home Medications:  Allergies as of 01/22/2017      Reactions   Codeine Sulfate Other (See Comments)   Reaction:  Unknown    Erythromycin Other (See Comments)   Reaction:  Unknown    Other    Other reaction(s): Unknown   Sulfa Antibiotics Other (See Comments)   Reaction:  Unknown    Ultracet [tramadol-acetaminophen] Other (See Comments)   Reaction:  Unknown       Medication List       Accurate as of 01/22/17  3:29 PM. Always use your most recent med list.          acetaminophen 650 MG CR tablet Commonly known as:  TYLENOL Take 1,300 mg by mouth 2 (two) times daily.   aspirin 81 MG chewable tablet Chew 1 tablet (81 mg total) by mouth daily.   clobetasol cream 0.05 % Commonly known as:  TEMOVATE Apply 1 application topically 2 (two) times daily as needed (for rash).   escitalopram 10 MG tablet Commonly known as:  LEXAPRO Take 10 mg by mouth daily.   etodolac 500 MG 24 hr tablet Commonly known as:  LODINE XL Take 500 mg  by mouth 2 (two) times daily.   fluticasone 50 MCG/ACT nasal spray Commonly known as:  FLONASE Place 1-2 sprays into both nostrils daily as needed for rhinitis.   hydrALAZINE 25 MG tablet Commonly known as:  APRESOLINE Take 1 tablet (25 mg total) by mouth every 8 (eight) hours.   HYDROcodone-acetaminophen 5-325 MG tablet Commonly known as:  NORCO/VICODIN 1/2-1 po bid prn   ipratropium-albuterol 0.5-2.5 (3) MG/3ML Soln Commonly known as:  DUONEB Inhale into the lungs.   loratadine 10 MG tablet Commonly known as:  CLARITIN Take 10 mg by mouth daily.   metFORMIN 500 MG  tablet Commonly known as:  GLUCOPHAGE Take 250-500 mg by mouth 2 (two) times daily with a meal. Pt takes one-half tablet with breakfast and one tablet with dinner.   omeprazole 40 MG capsule Commonly known as:  PRILOSEC Take 40 mg by mouth daily.   oxyCODONE 5 MG immediate release tablet Commonly known as:  Oxy IR/ROXICODONE Take 1 tablet (5 mg total) by mouth every 4 (four) hours as needed for moderate pain.   promethazine 25 MG tablet Commonly known as:  PHENERGAN Take 25 mg by mouth every 6 (six) hours as needed for nausea or vomiting.   simvastatin 40 MG tablet Commonly known as:  ZOCOR Take 40 mg by mouth at bedtime.   traZODone 50 MG tablet Commonly known as:  DESYREL Take 50 mg by mouth at bedtime as needed for sleep.   vitamin B-12 1000 MCG tablet Commonly known as:  CYANOCOBALAMIN Take by mouth.       Allergies:  Allergies  Allergen Reactions  . Codeine Sulfate Other (See Comments)    Reaction:  Unknown   . Erythromycin Other (See Comments)    Reaction:  Unknown   . Other     Other reaction(s): Unknown  . Sulfa Antibiotics Other (See Comments)    Reaction:  Unknown   . Ultracet [Tramadol-Acetaminophen] Other (See Comments)    Reaction:  Unknown     Family History: Family History  Problem Relation Age of Onset  . Heart attack    . Diabetes    . Dementia    . Prostate cancer Neg Hx   . Kidney cancer Neg Hx   . Bladder Cancer Neg Hx     Social History:  reports that she has been smoking.  She has been smoking about 0.50 packs per day. She has never used smokeless tobacco. She reports that she does not drink alcohol or use drugs.  ROS: UROLOGY Frequent Urination?: Yes Hard to postpone urination?: Yes Burning/pain with urination?: Yes Get up at night to urinate?: Yes Leakage of urine?: Yes Urine stream starts and stops?: No Trouble starting stream?: No Do you have to strain to urinate?: No Blood in urine?: Yes Urinary tract infection?:  No Sexually transmitted disease?: No Injury to kidneys or bladder?: No Painful intercourse?: No Weak stream?: No Currently pregnant?: No Vaginal bleeding?: No Last menstrual period?: n  Gastrointestinal Nausea?: No Vomiting?: No Indigestion/heartburn?: Yes Diarrhea?: No Constipation?: No  Constitutional Fever: No Night sweats?: No Weight loss?: No Fatigue?: Yes  Skin Skin rash/lesions?: No Itching?: Yes  Eyes Blurred vision?: Yes Double vision?: No  Ears/Nose/Throat Sore throat?: No Sinus problems?: Yes  Hematologic/Lymphatic Swollen glands?: No Easy bruising?: Yes  Cardiovascular Leg swelling?: Yes Chest pain?: No  Respiratory Cough?: Yes Shortness of breath?: Yes  Endocrine Excessive thirst?: Yes  Musculoskeletal Back pain?: Yes Joint pain?: Yes  Neurological Headaches?: No Dizziness?: Yes  Psychologic Depression?: Yes Anxiety?: Yes  Physical Exam: BP (!) 185/77   Pulse 97   Ht 5' (1.524 m)   Wt 134 lb 1.6 oz (60.8 kg)   BMI 26.19 kg/m   Constitutional: Well nourished. Alert and oriented, No acute distress. HEENT: [place] AT, moist mucus membranes. Trachea midline, no masses. Cardiovascular: No clubbing, cyanosis, or edema. Respiratory: Normal respiratory effort, no increased work of breathing. GI: Abdomen is soft, non tender, non distended, no abdominal masses. Liver and spleen not palpable.  No hernias appreciated.  Stool sample for occult testing is not indicated.   GU: No CVA tenderness.  No bladder fullness or masses.   Skin: No rashes, bruises or suspicious lesions. Lymph: No cervical or inguinal adenopathy. Neurologic: Grossly intact, no focal deficits, moving all 4 extremities. Psychiatric: Normal mood and affect.  Laboratory Data: Lab Results  Component Value Date   WBC 5.2 12/31/2016   HGB 11.5 (L) 12/31/2016   HCT 34.6 (L) 12/31/2016   MCV 88.6 12/31/2016   PLT 246 12/31/2016    Lab Results  Component Value Date    CREATININE 0.43 (L) 12/31/2016    Lab Results  Component Value Date   AST 27 12/31/2016   Lab Results  Component Value Date   ALT 16 12/31/2016    Urinalysis Unremarkable.  See EPIC.    Pertinent Imaging: CLINICAL DATA:  Chest pain since EGD yesterday. History of esophageal stricture.  EXAM: CT CHEST AND ABDOMEN WITH CONTRAST  TECHNIQUE: Multidetector CT imaging of the chest and abdomen was performed following the standard protocol during bolus administration of intravenous contrast.  CONTRAST:  59mL OMNIPAQUE IOHEXOL 300 MG/ML  SOLN  COMPARISON:  CT scan 05/17/2014  FINDINGS: CT CHEST WITH CONTRAST  Mediastinum/Lymph Nodes: No breast masses, supraclavicular or axillary lymphadenopathy. Thyroid goiter with a dominant 2.3 cm right thyroid lesion. Recommend ultrasound correlation and follow-up. Patient may require an FNA.  The heart is normal in size. No pericardial effusion. Moderate atherosclerotic calcifications involving the aorta but no aneurysm or dissection. The coronary arteries demonstrate atherosclerotic calcifications.  No mediastinal or hilar mass or adenopathy. No abnormal mediastinal fluid collections/hematoma.  Moderate to large hiatal hernia. No findings for esophageal perforation/leak.  Lungs/Pleura: No pneumothorax or pleural effusion. Moderate emphysematous changes and pulmonary scarring. No infiltrates or effusions. No worrisome pulmonary lesions.  Musculoskeletal: Advanced degenerative changes and osteoporosis involving the thoracic spine. No acute bony findings or destructive bony lesions.  CT ABDOMEN WITH CONTRAST  Hepatobiliary: No focal hepatic lesions or intrahepatic biliary dilatation. The gallbladder is surgically absent. No common bile duct dilatation.  Pancreas: No mass, inflammation or ductal dilatation.  Spleen: Normal size.  No focal lesions.  Adrenals/Urinary Tract: Stable left adrenal gland nodule  most consistent with a benign adenoma. The right adrenal gland is normal. There are small renal cysts. No worrisome renal lesions. A lower pole left renal calculus is noted.  Stomach/Bowel: The stomach, duodenum, small bowel and colon are unremarkable. No inflammatory changes, mass lesions or obstructive findings. Scattered colonic diverticulosis without findings for acute diverticulitis. There is a large diverticuli near the left kidney which probably contains retained barium.  Vascular/Lymphatic: No mesenteric or retroperitoneal mass or lymphadenopathy. Small scattered lymph nodes are noted. The aorta demonstrates advanced atherosclerotic calcifications. The major branch vessels are patent. The major venous structures are patent.  Other: No ascites or abdominal wall hernia.  Musculoskeletal: Small left-sided chest wall lipoma. Advanced degenerative changes involving the lumbar spine.  IMPRESSION: 1. Moderate to large  hiatal hernia. No findings for esophageal perforation. 2. Emphysematous changes but no acute pulmonary findings. 3. Advanced atherosclerotic calcifications involving the major vascular structures of the chest and abdomen. 4. Stable left adrenal gland adenoma. 5. Lower pole left renal calculus.   Electronically Signed   By: Marijo Sanes M.D.   On: 09/18/2015 15:02   Assessment & Plan:    1. Microscopic hematuria  - I explained to the patient that there are a number of causes that can be associated with blood in the urine, such as stones,  UTI's, damage to the urinary tract and/or cancer.  - At this time, I felt that the patient warranted further urologic evaluation.   The AUA guidelines state that a CT urogram is the preferred imaging study to evaluate hematuria.  - I explained to the patient that a contrast material will be injected into a vein and that in rare instances, an allergic reaction can result and may even life threatening   The patient  denies any allergies to contrast, iodine and/or seafood and is not taking metformin  - Her reproductive is postmenopausal   - Following the imaging study,  I've recommended a cystoscopy. I described how this is performed, typically in an office setting with a flexible cystoscope. We described the risks, benefits, and possible side effects, the most common of which is a minor amount of blood in the urine and/or burning which usually resolves in 24 to 48 hours.    - The patient had the opportunity to ask questions which were answered. Based upon this discussion, the patient is willing to proceed. Therefore, I've ordered: a CT Urogram and cystoscopy.  - The patient will return following all of the above for discussion of the results.   - UA  - Urine culture  - BUN + creatinine    Return for CT Urogram report and cystoscopy.  These notes generated with voice recognition software. I apologize for typographical errors.  Zara Council, [facility] [address] [place], [place] [address] [phone]

## 2017-01-23 LAB — BUN+CREAT
BUN / CREAT RATIO: 31 — AB (ref 12–28)
BUN: 19 mg/dL (ref 10–36)
Creatinine, Ser: 0.61 mg/dL (ref 0.57–1.00)
GFR calc Af Amer: 92 mL/min/{1.73_m2} (ref >59)
GFR calc non Af Amer: 80 mL/min/{1.73_m2} (ref >59)

## 2017-01-24 LAB — CULTURE, URINE COMPREHENSIVE

## 2017-02-09 ENCOUNTER — Ambulatory Visit
Admission: RE | Admit: 2017-02-09 | Discharge: 2017-02-09 | Disposition: A | Discharge status: Home / Self Care | Payer: Medicare Other | Source: Ambulatory Visit | Attending: Urology | Admitting: Urology

## 2017-02-09 DIAGNOSIS — K449 Diaphragmatic hernia without obstruction or gangrene: Secondary | ICD-10-CM | POA: Diagnosis not present

## 2017-02-09 DIAGNOSIS — D3502 Benign neoplasm of left adrenal gland: Secondary | ICD-10-CM | POA: Insufficient documentation

## 2017-02-09 DIAGNOSIS — R3129 Other microscopic hematuria: Secondary | ICD-10-CM | POA: Diagnosis present

## 2017-02-09 DIAGNOSIS — M47896 Other spondylosis, lumbar region: Secondary | ICD-10-CM | POA: Insufficient documentation

## 2017-02-09 DIAGNOSIS — K5710 Diverticulosis of small intestine without perforation or abscess without bleeding: Secondary | ICD-10-CM | POA: Insufficient documentation

## 2017-02-09 DIAGNOSIS — I700 Atherosclerosis of aorta: Secondary | ICD-10-CM | POA: Insufficient documentation

## 2017-02-09 DIAGNOSIS — N200 Calculus of kidney: Secondary | ICD-10-CM | POA: Diagnosis not present

## 2017-02-09 MED ORDER — IOPAMIDOL (ISOVUE-300) INJECTION 61%
125.0000 mL | Freq: Once | INTRAVENOUS | Status: AC | PRN
  Administered 2017-02-09: 125 mL via INTRAVENOUS

## 2017-02-12 ENCOUNTER — Ambulatory Visit: Payer: Medicare Other | Admitting: Urology

## 2017-02-12 ENCOUNTER — Encounter: Payer: Self-pay | Admitting: Urology

## 2017-02-12 VITALS — BP 148/70 | HR 86 | Ht 60.0 in | Wt 133.9 lb

## 2017-02-12 DIAGNOSIS — R3129 Other microscopic hematuria: Secondary | ICD-10-CM | POA: Diagnosis not present

## 2017-02-12 DIAGNOSIS — N200 Calculus of kidney: Secondary | ICD-10-CM

## 2017-02-12 LAB — URINALYSIS, COMPLETE
BILIRUBIN UA: POSITIVE — AB
GLUCOSE, UA: NEGATIVE
LEUKOCYTES UA: NEGATIVE
Nitrite, UA: NEGATIVE
RBC UA: NEGATIVE
SPEC GRAV UA: 1.025 (ref 1.005–1.030)
UUROB: 0.2 mg/dL (ref 0.2–1.0)
pH, UA: 5 (ref 5.0–7.5)

## 2017-02-12 LAB — MICROSCOPIC EXAMINATION: Bacteria, UA: NONE SEEN

## 2017-02-12 MED ORDER — LIDOCAINE HCL 2 % EX GEL
1.0000 "application " | Freq: Once | CUTANEOUS | Status: AC
  Administered 2017-02-12: 1 via URETHRAL

## 2017-02-12 MED ORDER — CIPROFLOXACIN HCL 500 MG PO TABS
500.0000 mg | ORAL_TABLET | Freq: Once | ORAL | Status: AC
  Administered 2017-02-12: 500 mg via ORAL

## 2017-02-12 NOTE — Progress Notes (Signed)
   02/12/17  CC:  Chief Complaint  Patient presents with  . Cysto    HPI: The patient is a 83 year old female presents today for completion of her microscopic hematuria workup. Her CT urogram was positive for 7 mm and 6 mm left lower pole stones and no other source.  There were no vitals taken for this visit. NED. A&Ox3.   No respiratory distress   Abd soft, NT, ND Normal external genitalia with patent urethral meatus  Cystoscopy Procedure Note  Patient identification was confirmed, informed consent was obtained, and patient was prepped using Betadine solution.  Lidocaine jelly was administered per urethral meatus.    Preoperative abx where received prior to procedure.    Procedure: - Flexible cystoscope introduced, without any difficulty.   - Thorough search of the bladder revealed:    normal urethral meatus    normal urothelium    no stones    no ulcers     no tumors    no urethral polyps    no trabeculation  - Ureteral orifices were normal in position and appearance.  Post-Procedure: - Patient tolerated the procedure well  Assessment/ Plan:  1. Nephrolithiasis -check KUB in one year to ensure no significant change in size  2. Microscopic hematuria -Negative work up except for above  Nickie Retort, MD

## 2017-05-05 IMAGING — CR DG WRIST COMPLETE 3+V*R*
1 series · 4 of 4 positions shown · non-contrast
Comparison: None.

CLINICAL DATA: Pain following fall 2 days prior

EXAM:
RIGHT WRIST - COMPLETE 3+ VIEW

[Series 1: pa · 0.17mm/px · 4 of 4 slices shown]
[im 1/4]
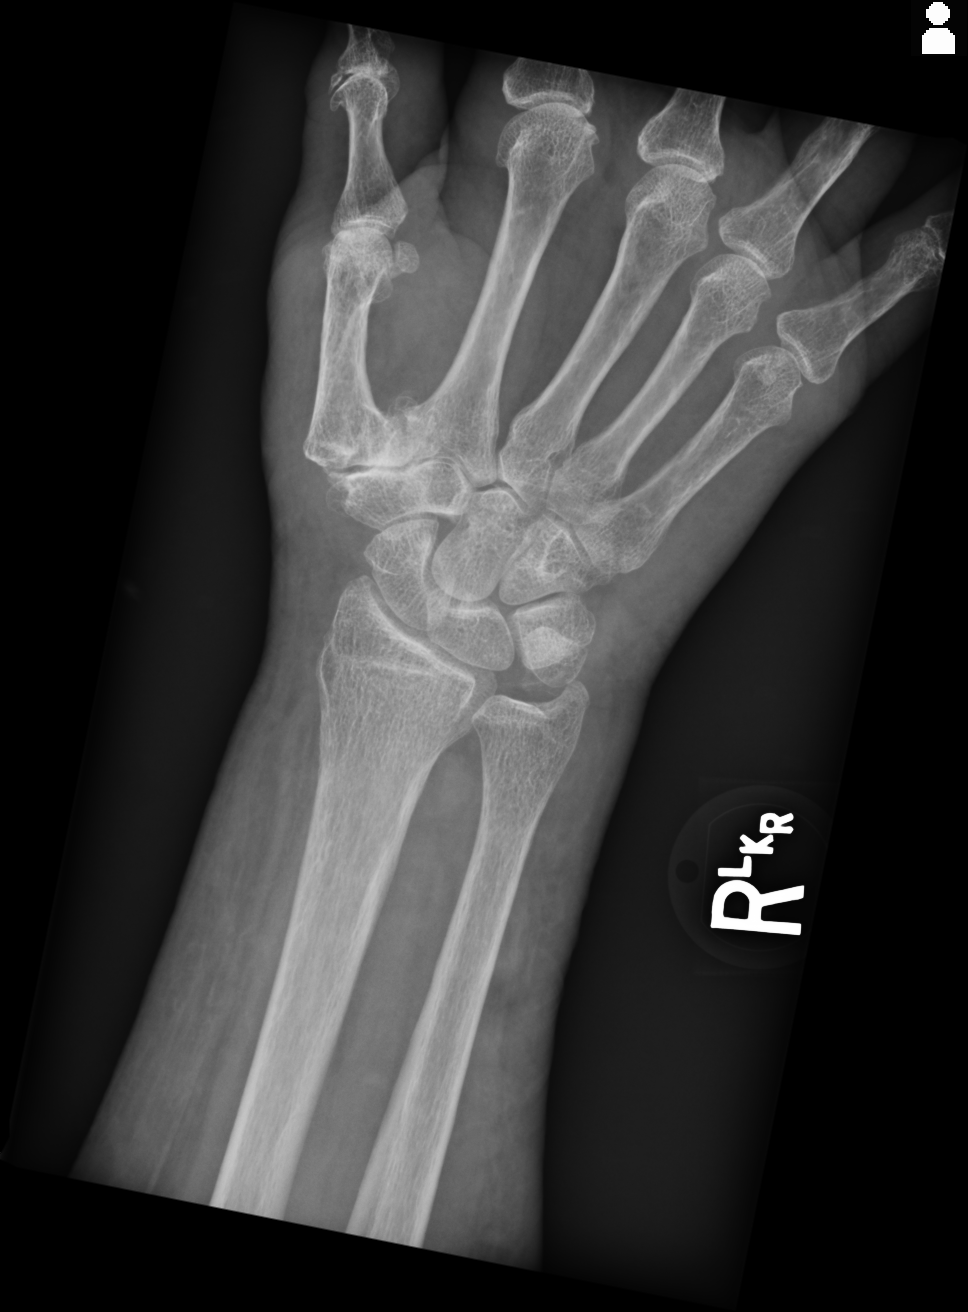
[im 2/4]
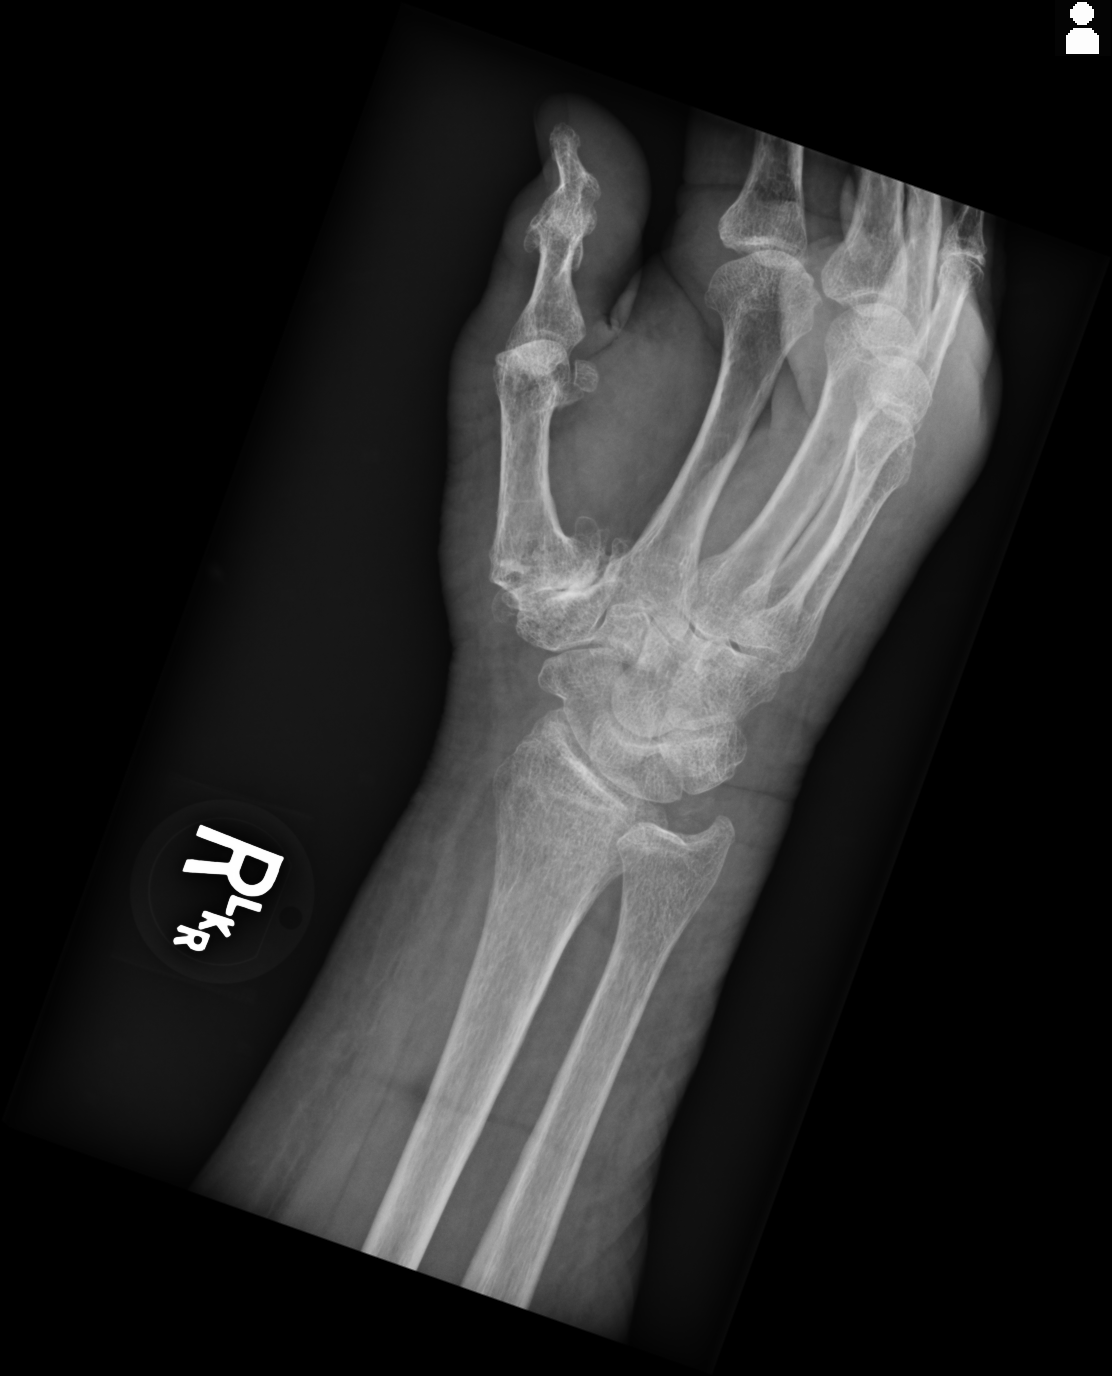
[im 3/4]
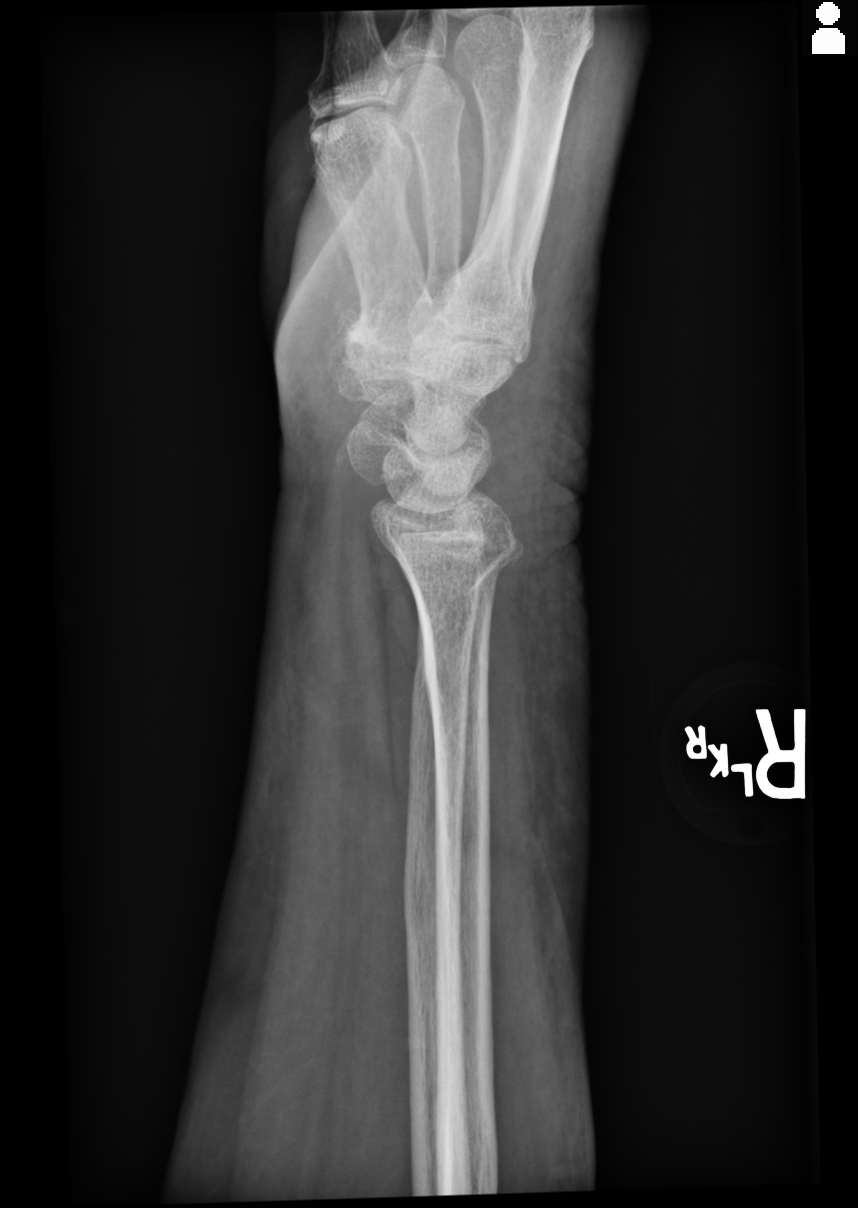
[im 4/4]
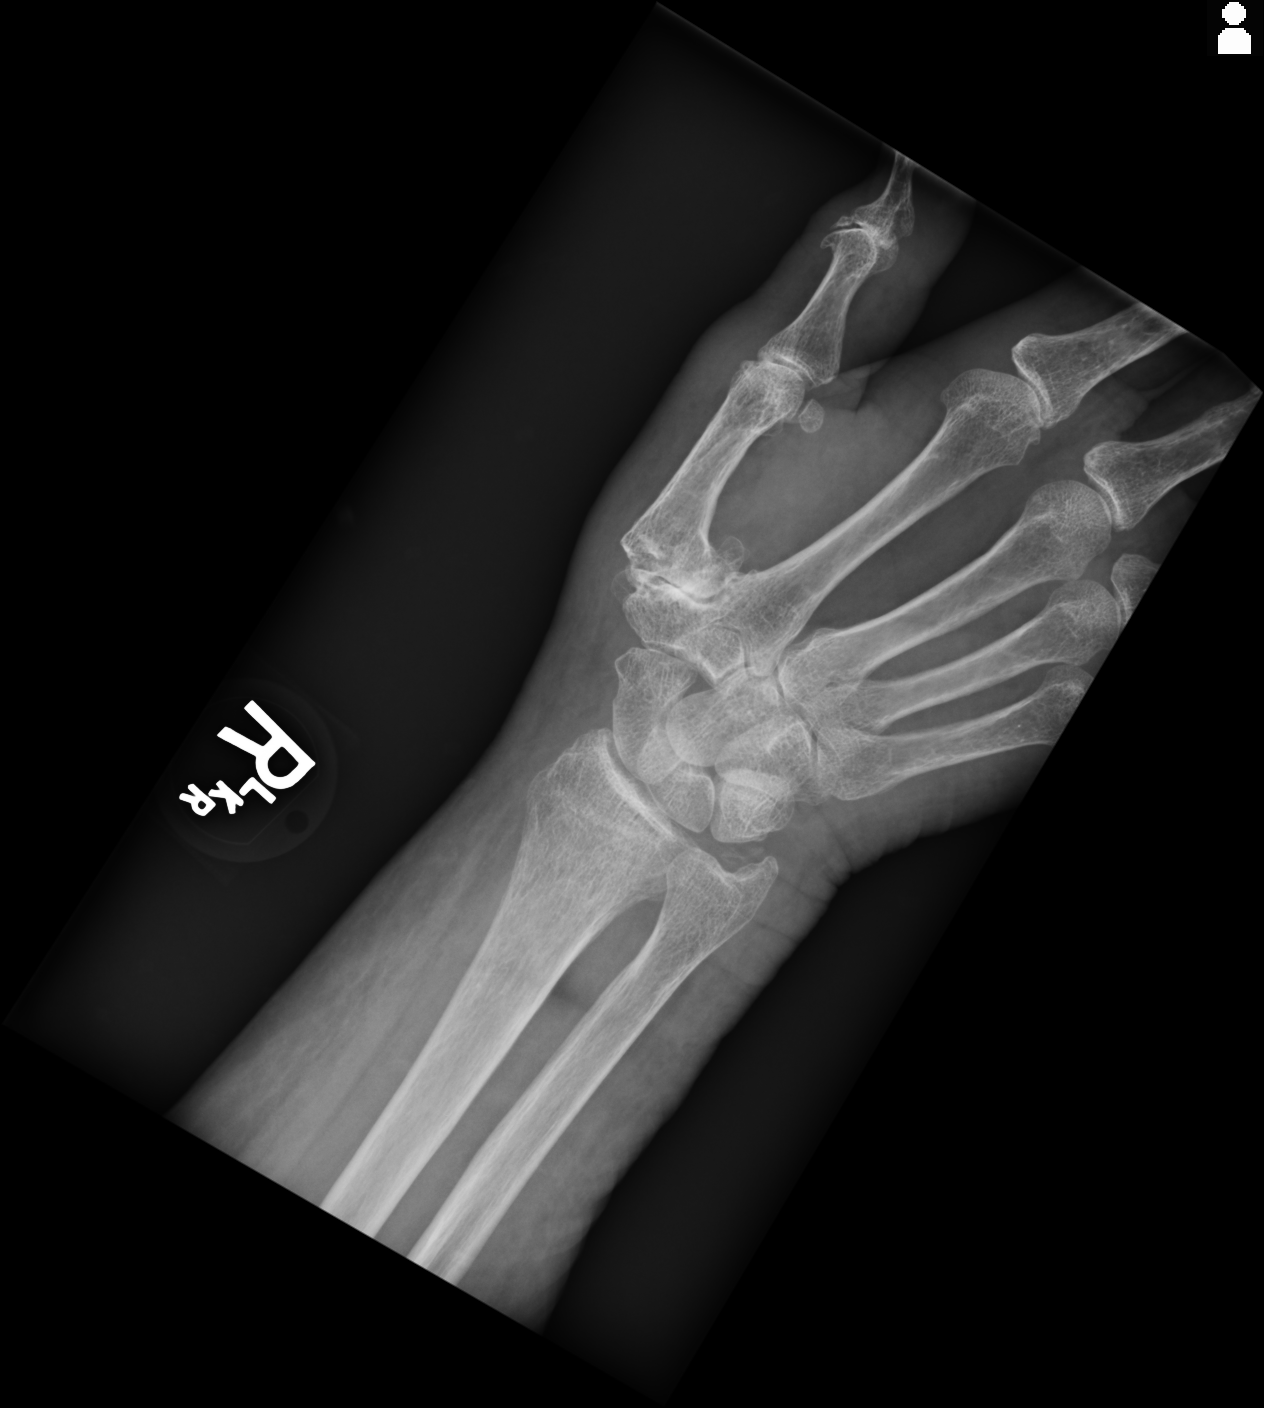

[4 of 4 positions shown; findings below may reference images not displayed]

FINDINGS: Frontal, oblique, lateral, and ulnar deviation scaphoid images were
obtained. There is soft tissue swelling in a generalized manner.
Bones are somewhat osteoporotic. There is a subtle transverse
lucency in the distal radial metaphysis, best seen on the ulnar
deviation scaphoid image. In this region on the lateral view, there
is questionable equivocal cortical disruption dorsally. This finding
is concerning for a nondisplaced fracture of the distal radial
metaphysis. There is no other evidence of potential fracture. No
dislocation. There is advanced osteoarthritic change in the first
carpal -metacarpal joint. There is moderate osteoarthritic change in
the scaphotrapezial joint. There is calcification in the triangular
fibrocartilage.
IMPRESSION: Subtle linear lucency in the distal radial metaphysis with
underlying osteoporosis. A nondisplaced fracture of the distal
radius is of concern given this appearance. No other evidence of
potential fracture. Advanced osteoarthritis in the first carpal
-metacarpal joint. Calcification in the triangular fibrocartilage is
concerning for potential chronic triangular fibrocartilage tear.
There is generalized soft tissue swelling.

## 2018-02-11 ENCOUNTER — Ambulatory Visit: Payer: Medicare Other

## 2019-06-01 ENCOUNTER — Emergency Department: Payer: Medicare Other

## 2019-06-01 ENCOUNTER — Other Ambulatory Visit: Payer: Self-pay

## 2019-06-01 ENCOUNTER — Inpatient Hospital Stay
Admission: EM | Admit: 2019-06-01 | Discharge: 2019-06-14 | DRG: 562 | Disposition: A | Discharge status: Hospice | Payer: Medicare Other | Attending: Internal Medicine | Admitting: Internal Medicine

## 2019-06-01 ENCOUNTER — Encounter: Payer: Self-pay | Admitting: *Deleted

## 2019-06-01 DIAGNOSIS — R0602 Shortness of breath: Secondary | ICD-10-CM

## 2019-06-01 DIAGNOSIS — E785 Hyperlipidemia, unspecified: Secondary | ICD-10-CM | POA: Diagnosis present

## 2019-06-01 DIAGNOSIS — J9601 Acute respiratory failure with hypoxia: Secondary | ICD-10-CM | POA: Diagnosis not present

## 2019-06-01 DIAGNOSIS — S42255A Nondisplaced fracture of greater tuberosity of left humerus, initial encounter for closed fracture: Secondary | ICD-10-CM | POA: Diagnosis not present

## 2019-06-01 DIAGNOSIS — Z79891 Long term (current) use of opiate analgesic: Secondary | ICD-10-CM

## 2019-06-01 DIAGNOSIS — Z833 Family history of diabetes mellitus: Secondary | ICD-10-CM

## 2019-06-01 DIAGNOSIS — Z7982 Long term (current) use of aspirin: Secondary | ICD-10-CM

## 2019-06-01 DIAGNOSIS — R110 Nausea: Secondary | ICD-10-CM | POA: Diagnosis not present

## 2019-06-01 DIAGNOSIS — Z66 Do not resuscitate: Secondary | ICD-10-CM | POA: Diagnosis present

## 2019-06-01 DIAGNOSIS — Z7984 Long term (current) use of oral hypoglycemic drugs: Secondary | ICD-10-CM

## 2019-06-01 DIAGNOSIS — S42292A Other displaced fracture of upper end of left humerus, initial encounter for closed fracture: Secondary | ICD-10-CM

## 2019-06-01 DIAGNOSIS — M1990 Unspecified osteoarthritis, unspecified site: Secondary | ICD-10-CM | POA: Diagnosis present

## 2019-06-01 DIAGNOSIS — Z9841 Cataract extraction status, right eye: Secondary | ICD-10-CM

## 2019-06-01 DIAGNOSIS — Z515 Encounter for palliative care: Secondary | ICD-10-CM

## 2019-06-01 DIAGNOSIS — F17210 Nicotine dependence, cigarettes, uncomplicated: Secondary | ICD-10-CM | POA: Diagnosis present

## 2019-06-01 DIAGNOSIS — W1830XA Fall on same level, unspecified, initial encounter: Secondary | ICD-10-CM | POA: Diagnosis present

## 2019-06-01 DIAGNOSIS — Z79899 Other long term (current) drug therapy: Secondary | ICD-10-CM

## 2019-06-01 DIAGNOSIS — Z882 Allergy status to sulfonamides status: Secondary | ICD-10-CM

## 2019-06-01 DIAGNOSIS — F329 Major depressive disorder, single episode, unspecified: Secondary | ICD-10-CM | POA: Diagnosis present

## 2019-06-01 DIAGNOSIS — M810 Age-related osteoporosis without current pathological fracture: Secondary | ICD-10-CM | POA: Diagnosis present

## 2019-06-01 DIAGNOSIS — J449 Chronic obstructive pulmonary disease, unspecified: Secondary | ICD-10-CM | POA: Diagnosis present

## 2019-06-01 DIAGNOSIS — Y92009 Unspecified place in unspecified non-institutional (private) residence as the place of occurrence of the external cause: Secondary | ICD-10-CM

## 2019-06-01 DIAGNOSIS — R062 Wheezing: Secondary | ICD-10-CM

## 2019-06-01 DIAGNOSIS — S32592A Other specified fracture of left pubis, initial encounter for closed fracture: Secondary | ICD-10-CM

## 2019-06-01 DIAGNOSIS — Z885 Allergy status to narcotic agent status: Secondary | ICD-10-CM

## 2019-06-01 DIAGNOSIS — Z20828 Contact with and (suspected) exposure to other viral communicable diseases: Secondary | ICD-10-CM | POA: Diagnosis present

## 2019-06-01 DIAGNOSIS — F0390 Unspecified dementia without behavioral disturbance: Secondary | ICD-10-CM | POA: Diagnosis present

## 2019-06-01 DIAGNOSIS — W19XXXA Unspecified fall, initial encounter: Secondary | ICD-10-CM

## 2019-06-01 DIAGNOSIS — S32591A Other specified fracture of right pubis, initial encounter for closed fracture: Secondary | ICD-10-CM | POA: Diagnosis present

## 2019-06-01 DIAGNOSIS — Z8249 Family history of ischemic heart disease and other diseases of the circulatory system: Secondary | ICD-10-CM

## 2019-06-01 DIAGNOSIS — E119 Type 2 diabetes mellitus without complications: Secondary | ICD-10-CM | POA: Diagnosis present

## 2019-06-01 DIAGNOSIS — B370 Candidal stomatitis: Secondary | ICD-10-CM | POA: Diagnosis present

## 2019-06-01 DIAGNOSIS — R296 Repeated falls: Secondary | ICD-10-CM | POA: Diagnosis present

## 2019-06-01 DIAGNOSIS — Z881 Allergy status to other antibiotic agents status: Secondary | ICD-10-CM

## 2019-06-01 DIAGNOSIS — Z9842 Cataract extraction status, left eye: Secondary | ICD-10-CM

## 2019-06-01 DIAGNOSIS — I10 Essential (primary) hypertension: Secondary | ICD-10-CM | POA: Diagnosis present

## 2019-06-01 DIAGNOSIS — Y9301 Activity, walking, marching and hiking: Secondary | ICD-10-CM | POA: Diagnosis present

## 2019-06-01 LAB — BASIC METABOLIC PANEL
Anion gap: 10 (ref 5–15)
BUN: 17 mg/dL (ref 8–23)
CO2: 23 mmol/L (ref 22–32)
Calcium: 8.8 mg/dL — ABNORMAL LOW (ref 8.9–10.3)
Chloride: 105 mmol/L (ref 98–111)
Creatinine, Ser: 0.67 mg/dL (ref 0.44–1.00)
GFR calc Af Amer: >60 mL/min (ref >60)
GFR calc non Af Amer: >60 mL/min (ref >60)
Glucose, Bld: 192 mg/dL — ABNORMAL HIGH (ref 70–99)
Potassium: 4.4 mmol/L (ref 3.5–5.1)
Sodium: 138 mmol/L (ref 135–145)

## 2019-06-01 LAB — CBC WITH DIFFERENTIAL/PLATELET
Abs Immature Granulocytes: 0.14 10*3/uL — ABNORMAL HIGH (ref 0.00–0.07)
Basophils Absolute: 0.1 10*3/uL (ref 0.0–0.1)
Basophils Relative: 0 %
Eosinophils Absolute: 0.1 10*3/uL (ref 0.0–0.5)
Eosinophils Relative: 1 %
HCT: 33.9 % — ABNORMAL LOW (ref 36.0–46.0)
Hemoglobin: 10.3 g/dL — ABNORMAL LOW (ref 12.0–15.0)
Immature Granulocytes: 1 %
Lymphocytes Relative: 12 %
Lymphs Abs: 1.4 10*3/uL (ref 0.7–4.0)
MCH: 24.5 pg — ABNORMAL LOW (ref 26.0–34.0)
MCHC: 30.4 g/dL (ref 30.0–36.0)
MCV: 80.5 fL (ref 80.0–100.0)
Monocytes Absolute: 0.9 10*3/uL (ref 0.1–1.0)
Monocytes Relative: 8 %
Neutro Abs: 9 10*3/uL — ABNORMAL HIGH (ref 1.7–7.7)
Neutrophils Relative %: 78 %
Platelets: 322 10*3/uL (ref 150–400)
RBC: 4.21 MIL/uL (ref 3.87–5.11)
RDW: 17.8 % — ABNORMAL HIGH (ref 11.5–15.5)
WBC: 11.5 10*3/uL — ABNORMAL HIGH (ref 4.0–10.5)
nRBC: 0 % (ref 0.0–0.2)

## 2019-06-01 LAB — PROTIME-INR
INR: 1 (ref 0.8–1.2)
Prothrombin Time: 12.7 seconds (ref 11.4–15.2)

## 2019-06-01 LAB — APTT: aPTT: 26 seconds (ref 24–36)

## 2019-06-01 MED ORDER — MORPHINE SULFATE (PF) 2 MG/ML IV SOLN
2.0000 mg | Freq: Once | INTRAVENOUS | Status: AC
  Administered 2019-06-01: 2 mg via INTRAVENOUS
  Filled 2019-06-01: qty 1

## 2019-06-01 MED ORDER — IBUPROFEN 600 MG PO TABS: 600.0000 mg | ORAL_TABLET | Freq: Once | ORAL | Status: DC

## 2019-06-01 MED ORDER — FENTANYL CITRATE (PF) 100 MCG/2ML IJ SOLN
25.0000 ug | Freq: Once | INTRAMUSCULAR | Status: AC
  Administered 2019-06-01: 25 ug via INTRAVENOUS
  Filled 2019-06-01: qty 2

## 2019-06-01 NOTE — ED Notes (Signed)
Pt placed on 3L O2 via nasal cannula due to low sats. SPo2 improved to 98%

## 2019-06-01 NOTE — ED Provider Notes (Signed)
[facility] Emergency Department Provider Note  ____________________________________________   First MD Initiated Contact with Patient 06/01/19 2051     (approximate)  I have reviewed the triage vital signs and the nursing notes.  History  Chief Complaint Fall and Shoulder Pain    HPI Katrina Jefferson is a 85 y.o. female with history as below, who presents for a fall, with resultant left-sided pain.  Patient states she was walking in her home with a walker, when she fell.  She is unsure of why she fell, but denies any preceding dizziness, lightheadedness, syncope, or chest pain.  She reports pain to the left side of her body, particularly her left shoulder, left wrist, and left hip area.  She denies any head injury or loss of consciousness.  She is not on any blood thinning medications.         Past Medical Hx Past Medical History:  Diagnosis Date  . Adrenal mass, left ([place])   . Arthritis   . COPD (chronic obstructive pulmonary disease) ([place])   . Depression   . Diabetes mellitus without complication ([place])   . Hyperlipidemia   . Insomnia   . Osteoporosis   . Retinal hemorrhage     Problem List Patient Active Problem List   Diagnosis Date Noted  . Frequent falls 11/28/2015  . COPD (chronic obstructive pulmonary disease) ([place]) 11/28/2015  . HLD (hyperlipidemia) 11/28/2015  . Accelerated hypertension 11/28/2015  . Falls 11/28/2015    Past Surgical Hx Past Surgical History:  Procedure Laterality Date  . APPENDECTOMY    . BACK SURGERY    . CATARACT EXTRACTION, BILATERAL    . ESOPHAGOGASTRODUODENOSCOPY (EGD) WITH PROPOFOL N/A 09/17/2015   Procedure: ESOPHAGOGASTRODUODENOSCOPY (EGD) WITH PROPOFOL;  Surgeon: Josefine Class, MD;  Location: [facility] ENDOSCOPY;  Service: Endoscopy;  Laterality: N/A;  . GALLBLADDER SURGERY    . TONSILLECTOMY      Medications Prior to Admission medications   Medication Sig Start Date End Date Taking?  Authorizing Provider  acetaminophen (TYLENOL) 650 MG CR tablet Take 1,300 mg by mouth 2 (two) times daily.    [provider]  aspirin 81 MG chewable tablet Chew 1 tablet (81 mg total) by mouth daily. Patient not taking: Reported on 01/22/2017 11/30/15   Epifanio Lesches, MD  clobetasol cream (TEMOVATE) [id number] % Apply 1 application topically 2 (two) times daily as needed (for rash).     [provider]  escitalopram (LEXAPRO) 10 MG tablet Take 10 mg by mouth daily.    [provider]  etodolac (LODINE XL) 500 MG 24 hr tablet Take 500 mg by mouth 2 (two) times daily.    [provider]  fluticasone (FLONASE) 50 MCG/ACT nasal spray Place 1-2 sprays into both nostrils daily as needed for rhinitis.     [provider]  hydrALAZINE (APRESOLINE) 25 MG tablet Take 1 tablet (25 mg total) by mouth every 8 (eight) hours. Patient not taking: Reported on 01/22/2017 11/30/15   Epifanio Lesches, MD  HYDROcodone-acetaminophen (NORCO/VICODIN) 5-325 MG tablet 1/2-1 po bid prn 11/17/16   [provider]  ipratropium-albuterol (DUONEB) 0.5-2.5 (3) MG/3ML SOLN Inhale into the lungs. 11/06/16   [provider]  loratadine (CLARITIN) 10 MG tablet Take 10 mg by mouth daily.    [provider]  metFORMIN (GLUCOPHAGE) 500 MG tablet Take 250-500 mg by mouth 2 (two) times daily with a meal. Pt takes one-half tablet with breakfast and one tablet with dinner.  [provider]  omeprazole (PRILOSEC) 40 MG capsule Take 40 mg by mouth daily.    [provider]  oxyCODONE (OXY IR/ROXICODONE) 5 MG immediate release tablet Take 1 tablet (5 mg total) by mouth every 4 (four) hours as needed for moderate pain. Patient not taking: Reported on 01/22/2017 11/30/15   Epifanio Lesches, MD  promethazine (PHENERGAN) 25 MG tablet Take 25 mg by mouth every 6 (six) hours as needed for nausea or vomiting.     [provider]  simvastatin (ZOCOR)  40 MG tablet Take 40 mg by mouth at bedtime.     [provider]  traZODone (DESYREL) 50 MG tablet Take 50 mg by mouth at bedtime as needed for sleep.     [provider]  vitamin B-12 (CYANOCOBALAMIN) 1000 MCG tablet Take by mouth.    [provider]    Allergies Codeine sulfate, Erythromycin, Other, Sulfa antibiotics, and Ultracet [tramadol-acetaminophen]  Family Hx Family History  Problem Relation Age of Onset  . Heart attack Other   . Diabetes Other   . Dementia Other   . Prostate cancer Neg Hx   . Kidney cancer Neg Hx   . Bladder Cancer Neg Hx     Social Hx Social History   Tobacco Use  . Smoking status: Current Every Day Smoker    Packs/day: 0.50  . Smokeless tobacco: Never Used  Substance Use Topics  . Alcohol use: No  . Drug use: No     Review of Systems  Constitutional: Negative for fever. Negative for chills. Eyes: Negative for visual changes. ENT: Negative for sore throat. Cardiovascular: Negative for chest pain. Respiratory: Negative for shortness of breath. Gastrointestinal: Negative for abdominal pain. Negative for nausea. Negative for vomiting. Genitourinary: Negative for dysuria. Musculoskeletal: + left shoulder, wrist, hip pain Skin: Negative for rash. Neurological: Negative for for headaches.   Physical Exam  Vital Signs: ED Triage Vitals [06/01/19 2047]  Enc Vitals Group     BP (!) 165/98     Pulse Rate (!) 101     Resp      Temp 98.2 F (36.8 C)     Temp Source Oral     SpO2 93 %     Weight 138 lb (62.6 kg)     Height 5\' 2"  (1.575 m)     Head Circumference      Peak Flow      Pain Score 10     Pain Loc      Pain Edu?      Excl. in [place]?     Constitutional: Alert and oriented.  Eyes: Conjunctivae clear. Sclera anicteric. Head: Normocephalic. Atraumatic.  Old ecchymosis to the forehead. Nose: No congestion. No rhinorrhea. Mouth/Throat: Mucous membranes are moist.  Neck: No stridor.  Full range of  motion.  No midline C-spine tenderness. Cardiovascular: Normal rate, regular rhythm. No murmurs. Extremities well perfused.  2+, symmetric radial and DP pulses.   Respiratory: Normal respiratory effort.  Lungs CTAB.  Left-sided chest wall tender, no underlying crepitance. Gastrointestinal: Soft and non-tender. No distention.  Musculoskeletal: Tenderness and deformity about the left shoulder.  Left wrist is tender, no associated deformity.  Left hip tender, no associated deformity. Neurologic:  Normal speech and language. No gross focal neurologic deficits are appreciated.  Skin: Skin is warm, dry and intact.  Old scattered ecchymosis.  No lacerations or abrasions. Psychiatric: Mood and affect are appropriate for situation.  EKG  Personally reviewed.   Rate: 103, tachycardic  Rhythm: Sinus Axis: Normal Intervals: Within normal limits Nonspecific ST changes No acute ischemic changes   Radiology  CT head, neck: No acute abnormalities  XR shoulder:  IMPRESSION: Acute, vertically oriented, minimally displaced fracture of the greater tuberosity of the humeral head.  XR wrist: IMPRESSION: Acute, vertically oriented, minimally displaced fracture of the greater tuberosity of the humeral head.  XR hip: IMPRESSION: Acute, displaced fractures involving the left superior and inferior pubic rami without definitive extension to involve the pubic Symphysis.  CXR: IMPRESSION: 1. Similar findings of borderline cardiomegaly, lung hyperexpansion and chronic bronchitic change without superimposed acute cardiopulmonary disease. 2. Hiatal hernia.   Procedures  Procedure(s) performed (including critical care):  Procedures   Initial Impression / Assessment and Plan / ED Course  85 y.o. female who presents to the ED for a fall with resultant left-sided pain, as described above.  Plan: we will obtain imaging of the affected areas, including CT head and neck, as well as x-rays of her  extremities.  Basic labs, EKG, urine to evaluate for any underlying derangement which may have contributed to her fall.  Work-up reveals minimally displaced fracture of the left humeral head, as well as left superior and inferior pubic rami fractures.  Discussed with orthopedics, these are nonoperative, they will follow in the morning.  Discussed with hospitalist for admission for pain control, PT/OT, etc.   Final Clinical Impression(s) / ED Diagnosis  Final diagnoses:  Fall     Note:  This document was prepared using Dragon voice recognition software and may include unintentional dictation errors.   Lilia Pro., MD 06/01/19 2227

## 2019-06-01 NOTE — ED Notes (Signed)
Patient transported to CT 

## 2019-06-01 NOTE — ED Triage Notes (Addendum)
Pt to ED after a fall this evening. Pt ambulates with a walker and has a recent fall last week as well. pain to left shouldr, ribs and hip. Pain with palpation of all listed areas. Old bruise on left side of forhead that pt reports is from fall last week. Pt is alert and oriented x 4.

## 2019-06-02 DIAGNOSIS — R296 Repeated falls: Secondary | ICD-10-CM | POA: Diagnosis present

## 2019-06-02 DIAGNOSIS — E785 Hyperlipidemia, unspecified: Secondary | ICD-10-CM | POA: Diagnosis present

## 2019-06-02 DIAGNOSIS — F0390 Unspecified dementia without behavioral disturbance: Secondary | ICD-10-CM | POA: Diagnosis present

## 2019-06-02 DIAGNOSIS — M1990 Unspecified osteoarthritis, unspecified site: Secondary | ICD-10-CM | POA: Diagnosis present

## 2019-06-02 DIAGNOSIS — Y9301 Activity, walking, marching and hiking: Secondary | ICD-10-CM | POA: Diagnosis present

## 2019-06-02 DIAGNOSIS — B370 Candidal stomatitis: Secondary | ICD-10-CM | POA: Diagnosis present

## 2019-06-02 DIAGNOSIS — S32591A Other specified fracture of right pubis, initial encounter for closed fracture: Secondary | ICD-10-CM | POA: Diagnosis present

## 2019-06-02 DIAGNOSIS — W1830XA Fall on same level, unspecified, initial encounter: Secondary | ICD-10-CM | POA: Diagnosis present

## 2019-06-02 DIAGNOSIS — F329 Major depressive disorder, single episode, unspecified: Secondary | ICD-10-CM | POA: Diagnosis present

## 2019-06-02 DIAGNOSIS — I10 Essential (primary) hypertension: Secondary | ICD-10-CM | POA: Diagnosis present

## 2019-06-02 DIAGNOSIS — Z7984 Long term (current) use of oral hypoglycemic drugs: Secondary | ICD-10-CM | POA: Diagnosis not present

## 2019-06-02 DIAGNOSIS — Z885 Allergy status to narcotic agent status: Secondary | ICD-10-CM | POA: Diagnosis not present

## 2019-06-02 DIAGNOSIS — Z882 Allergy status to sulfonamides status: Secondary | ICD-10-CM | POA: Diagnosis not present

## 2019-06-02 DIAGNOSIS — R110 Nausea: Secondary | ICD-10-CM | POA: Diagnosis not present

## 2019-06-02 DIAGNOSIS — Z9842 Cataract extraction status, left eye: Secondary | ICD-10-CM | POA: Diagnosis not present

## 2019-06-02 DIAGNOSIS — E119 Type 2 diabetes mellitus without complications: Secondary | ICD-10-CM | POA: Diagnosis present

## 2019-06-02 DIAGNOSIS — J449 Chronic obstructive pulmonary disease, unspecified: Secondary | ICD-10-CM | POA: Diagnosis present

## 2019-06-02 DIAGNOSIS — Z7982 Long term (current) use of aspirin: Secondary | ICD-10-CM | POA: Diagnosis not present

## 2019-06-02 DIAGNOSIS — Y92009 Unspecified place in unspecified non-institutional (private) residence as the place of occurrence of the external cause: Secondary | ICD-10-CM | POA: Diagnosis not present

## 2019-06-02 DIAGNOSIS — Z9841 Cataract extraction status, right eye: Secondary | ICD-10-CM | POA: Diagnosis not present

## 2019-06-02 DIAGNOSIS — S32592A Other specified fracture of left pubis, initial encounter for closed fracture: Secondary | ICD-10-CM | POA: Diagnosis present

## 2019-06-02 DIAGNOSIS — M810 Age-related osteoporosis without current pathological fracture: Secondary | ICD-10-CM | POA: Diagnosis present

## 2019-06-02 DIAGNOSIS — Z515 Encounter for palliative care: Secondary | ICD-10-CM | POA: Diagnosis not present

## 2019-06-02 DIAGNOSIS — J9601 Acute respiratory failure with hypoxia: Secondary | ICD-10-CM | POA: Diagnosis not present

## 2019-06-02 DIAGNOSIS — Z66 Do not resuscitate: Secondary | ICD-10-CM | POA: Diagnosis present

## 2019-06-02 DIAGNOSIS — S42255A Nondisplaced fracture of greater tuberosity of left humerus, initial encounter for closed fracture: Secondary | ICD-10-CM | POA: Diagnosis present

## 2019-06-02 DIAGNOSIS — Z20828 Contact with and (suspected) exposure to other viral communicable diseases: Secondary | ICD-10-CM | POA: Diagnosis present

## 2019-06-02 DIAGNOSIS — Z881 Allergy status to other antibiotic agents status: Secondary | ICD-10-CM | POA: Diagnosis not present

## 2019-06-02 LAB — CBC
HCT: 31.3 % — ABNORMAL LOW (ref 36.0–46.0)
Hemoglobin: 9.2 g/dL — ABNORMAL LOW (ref 12.0–15.0)
MCH: 24 pg — ABNORMAL LOW (ref 26.0–34.0)
MCHC: 29.4 g/dL — ABNORMAL LOW (ref 30.0–36.0)
MCV: 81.5 fL (ref 80.0–100.0)
Platelets: 294 10*3/uL (ref 150–400)
RBC: 3.84 MIL/uL — ABNORMAL LOW (ref 3.87–5.11)
RDW: 17.8 % — ABNORMAL HIGH (ref 11.5–15.5)
WBC: 9.1 10*3/uL (ref 4.0–10.5)
nRBC: 0 % (ref 0.0–0.2)

## 2019-06-02 LAB — BASIC METABOLIC PANEL
Anion gap: 9 (ref 5–15)
BUN: 18 mg/dL (ref 8–23)
CO2: 25 mmol/L (ref 22–32)
Calcium: 8.6 mg/dL — ABNORMAL LOW (ref 8.9–10.3)
Chloride: 106 mmol/L (ref 98–111)
Creatinine, Ser: 0.56 mg/dL (ref 0.44–1.00)
GFR calc Af Amer: >60 mL/min (ref >60)
GFR calc non Af Amer: >60 mL/min (ref >60)
Glucose, Bld: 196 mg/dL — ABNORMAL HIGH (ref 70–99)
Potassium: 4.4 mmol/L (ref 3.5–5.1)
Sodium: 140 mmol/L (ref 135–145)

## 2019-06-02 LAB — GLUCOSE, CAPILLARY
Glucose-Capillary: 123 mg/dL — ABNORMAL HIGH (ref 70–99)
Glucose-Capillary: 142 mg/dL — ABNORMAL HIGH (ref 70–99)

## 2019-06-02 MED ORDER — ACETAMINOPHEN 650 MG RE SUPP: 650.0000 mg | Freq: Four times a day (QID) | RECTAL | Status: DC | PRN

## 2019-06-02 MED ORDER — ONDANSETRON HCL 4 MG/2ML IJ SOLN
4.0000 mg | Freq: Four times a day (QID) | INTRAMUSCULAR | Status: DC | PRN
  Administered 2019-06-03: 09:00:00 4 mg via INTRAVENOUS
  Filled 2019-06-02: qty 2

## 2019-06-02 MED ORDER — ESCITALOPRAM OXALATE 10 MG PO TABS
20.0000 mg | ORAL_TABLET | Freq: Every day | ORAL | Status: DC
  Administered 2019-06-02 – 2019-06-14 (×12): 20 mg via ORAL
  Filled 2019-06-02 (×14): qty 2

## 2019-06-02 MED ORDER — ENOXAPARIN SODIUM 40 MG/0.4ML ~~LOC~~ SOLN
40.0000 mg | SUBCUTANEOUS | Status: DC
  Administered 2019-06-03: 23:00:00 40 mg via SUBCUTANEOUS
  Filled 2019-06-02: qty 0.4

## 2019-06-02 MED ORDER — HYDROMORPHONE HCL 1 MG/ML IJ SOLN
1.0000 mg | INTRAMUSCULAR | Status: AC
  Administered 2019-06-02: 01:00:00 1 mg via INTRAVENOUS

## 2019-06-02 MED ORDER — METFORMIN HCL 500 MG PO TABS
1000.0000 mg | ORAL_TABLET | Freq: Two times a day (BID) | ORAL | Status: DC
  Administered 2019-06-02 – 2019-06-04 (×4): 1000 mg via ORAL
  Filled 2019-06-02 (×6): qty 2

## 2019-06-02 MED ORDER — ONDANSETRON HCL 4 MG PO TABS: 4.0000 mg | ORAL_TABLET | Freq: Four times a day (QID) | ORAL | Status: DC | PRN

## 2019-06-02 MED ORDER — SIMVASTATIN 20 MG PO TABS
40.0000 mg | ORAL_TABLET | Freq: Every day | ORAL | Status: DC
  Administered 2019-06-02 – 2019-06-03 (×2): 40 mg via ORAL
  Filled 2019-06-02 (×2): qty 2

## 2019-06-02 MED ORDER — MORPHINE SULFATE (PF) 2 MG/ML IV SOLN
2.0000 mg | INTRAVENOUS | Status: DC | PRN
  Administered 2019-06-02 – 2019-06-10 (×9): 2 mg via INTRAVENOUS
  Filled 2019-06-02 (×9): qty 1

## 2019-06-02 MED ORDER — INSULIN ASPART 100 UNIT/ML ~~LOC~~ SOLN
0.0000 [IU] | Freq: Three times a day (TID) | SUBCUTANEOUS | Status: DC
  Administered 2019-06-02: 18:00:00 1 [IU] via SUBCUTANEOUS
  Administered 2019-06-03 (×2): 2 [IU] via SUBCUTANEOUS
  Administered 2019-06-03: 1 [IU] via SUBCUTANEOUS
  Administered 2019-06-04: 10:00:00 5 [IU] via SUBCUTANEOUS
  Filled 2019-06-02 (×5): qty 1

## 2019-06-02 MED ORDER — ALBUTEROL SULFATE (2.5 MG/3ML) 0.083% IN NEBU
2.5000 mg | INHALATION_SOLUTION | RESPIRATORY_TRACT | Status: DC | PRN
  Administered 2019-06-03 – 2019-06-12 (×4): 2.5 mg via RESPIRATORY_TRACT
  Filled 2019-06-02 (×4): qty 3

## 2019-06-02 MED ORDER — AMLODIPINE BESYLATE 5 MG PO TABS
2.5000 mg | ORAL_TABLET | Freq: Every day | ORAL | Status: DC
  Administered 2019-06-02: 2.5 mg via ORAL
  Filled 2019-06-02 (×4): qty 1

## 2019-06-02 MED ORDER — INSULIN ASPART 100 UNIT/ML ~~LOC~~ SOLN: 0.0000 [IU] | Freq: Every day | SUBCUTANEOUS | Status: DC

## 2019-06-02 MED ORDER — SODIUM CHLORIDE 0.9% FLUSH
3.0000 mL | Freq: Two times a day (BID) | INTRAVENOUS | Status: DC
  Administered 2019-06-02 – 2019-06-14 (×19): 3 mL via INTRAVENOUS

## 2019-06-02 MED ORDER — VITAMIN B-12 1000 MCG PO TABS
1000.0000 ug | ORAL_TABLET | Freq: Every day | ORAL | Status: DC
  Administered 2019-06-02 – 2019-06-04 (×2): 1000 ug via ORAL
  Filled 2019-06-02 (×3): qty 1

## 2019-06-02 MED ORDER — TRAZODONE HCL 100 MG PO TABS
100.0000 mg | ORAL_TABLET | Freq: Every day | ORAL | Status: DC
  Administered 2019-06-02 – 2019-06-03 (×2): 100 mg via ORAL
  Filled 2019-06-02 (×3): qty 1

## 2019-06-02 MED ORDER — HYDROMORPHONE HCL 1 MG/ML IJ SOLN
INTRAMUSCULAR | Status: AC
  Administered 2019-06-02: 1 mg via INTRAVENOUS
  Filled 2019-06-02: qty 1

## 2019-06-02 MED ORDER — LORATADINE 10 MG PO TABS
10.0000 mg | ORAL_TABLET | Freq: Every day | ORAL | Status: DC
  Administered 2019-06-02 – 2019-06-04 (×2): 10 mg via ORAL
  Filled 2019-06-02 (×3): qty 1

## 2019-06-02 MED ORDER — PANTOPRAZOLE SODIUM 40 MG PO TBEC
40.0000 mg | DELAYED_RELEASE_TABLET | Freq: Every day | ORAL | Status: DC
  Administered 2019-06-02 – 2019-06-04 (×2): 40 mg via ORAL
  Filled 2019-06-02 (×3): qty 1

## 2019-06-02 MED ORDER — OXYCODONE-ACETAMINOPHEN 5-325 MG PO TABS
1.0000 | ORAL_TABLET | Freq: Four times a day (QID) | ORAL | Status: DC | PRN
  Administered 2019-06-02 – 2019-06-04 (×2): 1 via ORAL
  Filled 2019-06-02 (×2): qty 1

## 2019-06-02 MED ORDER — ENOXAPARIN SODIUM 40 MG/0.4ML ~~LOC~~ SOLN: 40.0000 mg | SUBCUTANEOUS | Status: DC

## 2019-06-02 MED ORDER — ACETAMINOPHEN 325 MG PO TABS
650.0000 mg | ORAL_TABLET | Freq: Four times a day (QID) | ORAL | Status: DC | PRN
  Administered 2019-06-02: 650 mg via ORAL
  Filled 2019-06-02: qty 2

## 2019-06-02 NOTE — Progress Notes (Signed)
Advanced care plan.  Purpose of the Encounter: CODE STATUS  Parties in Attendance: Patient herself  Patient's Decision Capacity: Intact  Subjective/Patient's story: Katrina Jefferson is a 85 y.o. female who complains of left shoulder and hip pain after a fall at home yesterday.  Patient explains that she was going to put strawberries back in her refrigerator.  She was using her walker but unexpectedly fell backwards onto her left side.  Patient was diagnosed with a nondisplaced greater tuberosity fracture of the left proximal humerus as well as superior and inferior pubic rami fractures   Objective/Medical story I discussed with the patient regarding her desires for cardiac and pulmonary resuscitation.   Goals of care determination:   Patient does not want to be resuscitated and does not want to have CPR  CODE STATUS: DO NOT RESUSCITATE   Time spent discussing advanced care planning: 16 minutes

## 2019-06-02 NOTE — Consult Note (Signed)
ORTHOPAEDIC CONSULTATION  REQUESTING PHYSICIAN: Dustin Flock, MD  Chief Complaint: Left shoulder and hip pain status post fall  HPI: Katrina Jefferson is a 85 y.o. female who complains of left shoulder and hip pain after a fall at home yesterday.  Patient explains that she was going to put strawberries back in her refrigerator.  She was using her walker but unexpectedly fell backwards onto her left side.  Patient was diagnosed with a nondisplaced greater tuberosity fracture of the left proximal humerus as well as superior and inferior pubic rami fractures.  Orthopedics is consulted for management of the fractures.  Past Medical History:  Diagnosis Date  . Adrenal mass, left ([place])   . Arthritis   . COPD (chronic obstructive pulmonary disease) ([place])   . Depression   . Diabetes mellitus without complication ([place])   . Hyperlipidemia   . Insomnia   . Osteoporosis   . Retinal hemorrhage    Past Surgical History:  Procedure Laterality Date  . APPENDECTOMY    . BACK SURGERY    . CATARACT EXTRACTION, BILATERAL    . ESOPHAGOGASTRODUODENOSCOPY (EGD) WITH PROPOFOL N/A 09/17/2015   Procedure: ESOPHAGOGASTRODUODENOSCOPY (EGD) WITH PROPOFOL;  Surgeon: Josefine Class, MD;  Location: [facility] ENDOSCOPY;  Service: Endoscopy;  Laterality: N/A;  . GALLBLADDER SURGERY    . TONSILLECTOMY     Social History   Socioeconomic History  . Marital status: Widowed    Spouse name: Not on file  . Number of children: Not on file  . Years of education: Not on file  . Highest education level: Not on file  Occupational History  . Not on file  Social Needs  . Financial resource strain: Not on file  . Food insecurity    Worry: Not on file    Inability: Not on file  . Transportation needs    Medical: Not on file    Non-medical: Not on file  Tobacco Use  . Smoking status: Current Every Day Smoker    Packs/day: 0.50  . Smokeless tobacco: Never Used  Substance and Sexual Activity  . Alcohol use:  No  . Drug use: No  . Sexual activity: Not on file  Lifestyle  . Physical activity    Days per week: Not on file    Minutes per session: Not on file  . Stress: Not on file  Relationships  . Social Herbalist on phone: Not on file    Gets together: Not on file    Attends religious service: Not on file    Active member of club or organization: Not on file    Attends meetings of clubs or organizations: Not on file    Relationship status: Not on file  Other Topics Concern  . Not on file  Social History Narrative  . Not on file   Family History  Problem Relation Age of Onset  . Heart attack Other   . Diabetes Other   . Dementia Other   . Prostate cancer Neg Hx   . Kidney cancer Neg Hx   . Bladder Cancer Neg Hx    Allergies  Allergen Reactions  . Codeine Sulfate Other (See Comments)    Reaction:  Unknown   . Erythromycin Other (See Comments)    Reaction:  Unknown   . Sulfa Antibiotics Other (See Comments)    Reaction:  Unknown   . Ultracet [Tramadol-Acetaminophen] Other (See Comments)    Reaction:  Unknown    Prior to Admission medications  Medication Sig Start Date End Date Taking? Authorizing Provider  acetaminophen (TYLENOL) 500 MG tablet Take 1,000 mg by mouth every 6 (six) hours as needed for mild pain, fever or headache.   Yes [provider]  albuterol (VENTOLIN HFA) 108 (90 Base) MCG/ACT inhaler Inhale 2 puffs into the lungs every 4 (four) hours as needed for wheezing or shortness of breath.   Yes [provider]  amLODipine (NORVASC) 2.5 MG tablet Take 2.5 mg by mouth daily.   Yes [provider]  escitalopram (LEXAPRO) 20 MG tablet Take 20 mg by mouth daily.    Yes [provider]  etodolac (LODINE XL) 500 MG 24 hr tablet Take 500 mg by mouth 2 (two) times daily as needed (pain).    Yes [provider]  HYDROcodone-acetaminophen (NORCO/VICODIN) 5-325 MG tablet Take 0.5-1 tablets by mouth at bedtime as needed  for moderate pain.  11/17/16  Yes [provider]  loratadine (CLARITIN) 10 MG tablet Take 10 mg by mouth daily.   Yes [provider]  metFORMIN (GLUCOPHAGE) 1000 MG tablet Take 1,000 mg by mouth 2 (two) times daily with a meal.    Yes [provider]  omeprazole (PRILOSEC) 40 MG capsule Take 40 mg by mouth daily.   Yes [provider]  promethazine (PHENERGAN) 25 MG tablet Take 25 mg by mouth every 4 (four) hours as needed for nausea or vomiting.    Yes [provider]  simvastatin (ZOCOR) 40 MG tablet Take 40 mg by mouth at bedtime.    Yes [provider]  traZODone (DESYREL) 100 MG tablet Take 100 mg by mouth at bedtime.    Yes [provider]  vitamin B-12 (CYANOCOBALAMIN) 1000 MCG tablet Take 1,000 mcg by mouth daily.    Yes [provider]   Dg Chest 1 View  Result Date: 06/01/2019 CLINICAL DATA:  Post fall EXAM: CHEST  1 VIEW COMPARISON:  09/18/2015 FINDINGS: Grossly unchanged borderline enlarged cardiac silhouette and mediastinal contours with atherosclerotic plaque within the thoracic aorta. Retrocardiac opacity compatible with known hiatal hernia. The lungs remain hyperexpanded with flattening of the diaphragms and mild diffuse slightly nodular thickening of the pulmonary interstitium. No discrete focal airspace opacities. No pleural effusion or pneumothorax. No evidence of edema. No definite acute osseous abnormalities. IMPRESSION: 1. Similar findings of borderline cardiomegaly, lung hyperexpansion and chronic bronchitic change without superimposed acute cardiopulmonary disease. 2. Hiatal hernia. Electronically Signed   By: Sandi Mariscal M.D.   On: 06/01/2019 21:39   Dg Wrist Complete Left  Result Date: 06/01/2019 CLINICAL DATA:  Post fall, now with left wrist pain. EXAM: LEFT WRIST - COMPLETE 3+ VIEW COMPARISON:  None. FINDINGS: Osteopenia. Mild diffuse soft tissue swelling about the wrist without associated displaced  fracture or dislocation. Mild-to-moderate degenerative change involving the STT joints of the base of thumb with joint space loss, subchondral sclerosis osteophytosis. Remaining joint spaces appear preserved. No erosions. Suspected minimal amount of chondrocalcinosis within the TFCC. IMPRESSION: Mild diffuse soft tissue swelling about the wrist without associated fracture or dislocation. If the patient has pain referable to the anatomic snuff box, splinting and a follow-up radiographs in 10 to 14 days is recommended to evaluate for occult scaphoid fracture. Electronically Signed   By: Sandi Mariscal M.D.   On: 06/01/2019 21:37   Ct Head Wo Contrast  Result Date: 06/01/2019 CLINICAL DATA:  Fall, headache EXAM: CT HEAD WITHOUT CONTRAST CT CERVICAL SPINE WITHOUT CONTRAST TECHNIQUE: Multidetector CT imaging of the  head and cervical spine was performed following the standard protocol without intravenous contrast. Multiplanar CT image reconstructions of the cervical spine were also generated. COMPARISON:  11/28/2015 FINDINGS: CT HEAD FINDINGS Brain: There is atrophy and chronic small vessel disease changes. No acute intracranial abnormality. Specifically, no hemorrhage, hydrocephalus, mass lesion, acute infarction, or significant intracranial injury. Vascular: No hyperdense vessel or unexpected calcification. Skull: No acute calvarial abnormality. Sinuses/Orbits: Visualized paranasal sinuses and mastoids clear. Orbital soft tissues unremarkable. Other: None CT CERVICAL SPINE FINDINGS Alignment: 2 mm degenerative anterolisthesis of C4 on C5. Skull base and vertebrae: No acute fracture. No primary bone lesion or focal pathologic process. Soft tissues and spinal canal: No prevertebral fluid or swelling. No visible canal hematoma. Disc levels:  Diffuse degenerative disc and facet disease. Upper chest: No acute findings Other: Nodule in the right thyroid lobe measures 2 cm. IMPRESSION: Atrophy, chronic microvascular disease.  No acute intracranial abnormality. Diffuse degenerative disc and facet disease. No acute bony abnormality. Electronically Signed   By: Rolm Baptise M.D.   On: 06/01/2019 21:23   Ct Cervical Spine Wo Contrast  Result Date: 06/01/2019 CLINICAL DATA:  Fall, headache EXAM: CT HEAD WITHOUT CONTRAST CT CERVICAL SPINE WITHOUT CONTRAST TECHNIQUE: Multidetector CT imaging of the head and cervical spine was performed following the standard protocol without intravenous contrast. Multiplanar CT image reconstructions of the cervical spine were also generated. COMPARISON:  11/28/2015 FINDINGS: CT HEAD FINDINGS Brain: There is atrophy and chronic small vessel disease changes. No acute intracranial abnormality. Specifically, no hemorrhage, hydrocephalus, mass lesion, acute infarction, or significant intracranial injury. Vascular: No hyperdense vessel or unexpected calcification. Skull: No acute calvarial abnormality. Sinuses/Orbits: Visualized paranasal sinuses and mastoids clear. Orbital soft tissues unremarkable. Other: None CT CERVICAL SPINE FINDINGS Alignment: 2 mm degenerative anterolisthesis of C4 on C5. Skull base and vertebrae: No acute fracture. No primary bone lesion or focal pathologic process. Soft tissues and spinal canal: No prevertebral fluid or swelling. No visible canal hematoma. Disc levels:  Diffuse degenerative disc and facet disease. Upper chest: No acute findings Other: Nodule in the right thyroid lobe measures 2 cm. IMPRESSION: Atrophy, chronic microvascular disease. No acute intracranial abnormality. Diffuse degenerative disc and facet disease. No acute bony abnormality. Electronically Signed   By: Rolm Baptise M.D.   On: 06/01/2019 21:23   Dg Shoulder Left  Result Date: 06/01/2019 CLINICAL DATA:  Post fall, now with left shoulder pain. EXAM: LEFT SHOULDER - 2+ VIEW COMPARISON:  None. FINDINGS: There is an acute vertically oriented minimally displaced fracture involving the greater tuberosity of the  humeral head. No definitive intra-articular extension. Expected adjacent soft tissue swelling. No radiopaque foreign body. No additional fractures identified. Glenohumeral acromioclavicular joint spaces appear preserved. Linear calcifications regional to the rotator cuff insertion site as could be seen in the setting of calcific tendinitis. Limited visualization of the adjacent thorax is normal. IMPRESSION: Acute, vertically oriented, minimally displaced fracture of the greater tuberosity of the humeral head. Electronically Signed   By: Sandi Mariscal M.D.   On: 06/01/2019 21:34   Dg Hip Unilat With Pelvis 2-3 Views Left  Result Date: 06/01/2019 CLINICAL DATA:  Post fall, now with left hip pain. EXAM: DG HIP (WITH OR WITHOUT PELVIS) 2-3V LEFT COMPARISON:  None. FINDINGS: Acute, displaced fractures involving the left superior and inferior pubic rami without definitive extension to involve the pubic symphysis. No additional fractures identified. Mild degenerative change of the left hip with joint space loss, subchondral sclerosis and osteophytosis. No evidence of avascular  necrosis. Similar mild degenerative change of the contralateral right hip is suspected though incompletely evaluated. Expected adjacent soft tissue swelling.  No radiopaque foreign body. IMPRESSION: Acute, displaced fractures involving the left superior and inferior pubic rami without definitive extension to involve the pubic symphysis. Electronically Signed   By: Sandi Mariscal M.D.   On: 06/01/2019 21:36    Positive ROS: All other systems have been reviewed and were otherwise negative with the exception of those mentioned in the HPI and as above.  Physical Exam: General: Alert, no acute distress  MUSCULOSKELETAL:  Left shoulder: Patient skin is intact.  She has mild swelling but no erythema or ecchymosis.  Her left arm is in a sling.  She can flex and extend all 5 digits of the left hand, has intact sensation throughout the left upper  extremity has a palpable radial pulse.  She has tenderness to palpation of the greater tuberosity.  Left lower extremity: Patient has palpable pedal pulses, intact sensation to light touch throughout the left lower extremity and intact motor function distally.  She had pain with logrolling of the left hip and tenderness over the anterior hip and pubis.  Assessment: #1) left nondisplaced greater tuberosity fracture of the proximal humerus #2) nondisplaced superior and inferior left pubic rami fractures  Plan: I have reviewed the patient's x-rays.  Her proximal humerus and pubic rami fractures are nondisplaced.  They will not require surgical intervention.  Patient should avoid active abduction or forward flexion of her left shoulder to prevent displacement of her greater tuberosity fracture.  She may use her walker but should avoid any lifting with the left upper extremity.  In regards to her pubic rami fractures, she may weight-bear as tolerated on left lower extremity.  I recommend physical therapy evaluation of the patient for safety.  If she is having significant issues with balance she may require skilled nursing facility upon discharge.  Patient should follow-up in my office in approximately 2 weeks.    Thornton Park, MD    06/02/2019 1:02 PM

## 2019-06-02 NOTE — Progress Notes (Signed)
[place] at [facility] [place]                                                                                                                                                                                  Patient Demographics   Katrina Jefferson, is a 85 y.o. female, DOB - [birth year], OX:[id number]  Admit date - 06/01/2019   Admitting Physician Christel Mormon, MD  Outpatient Primary MD for the patient is Glendon Axe, MD   LOS - 0  Subjective: Patient having pain in multiple areas    Review of Systems:   CONSTITUTIONAL: No documented fever. No fatigue, weakness. No weight gain, no weight loss.  EYES: No blurry or double vision.  ENT: No tinnitus. No postnasal drip. No redness of the oropharynx.  RESPIRATORY: No cough, no wheeze, no hemoptysis. No dyspnea.  CARDIOVASCULAR: No chest pain. No orthopnea. No palpitations. No syncope.  GASTROINTESTINAL: No nausea, no vomiting or diarrhea. No abdominal pain. No melena or hematochezia.  GENITOURINARY: No dysuria or hematuria.  ENDOCRINE: No polyuria or nocturia. No heat or cold intolerance.  HEMATOLOGY: No anemia. No bruising. No bleeding.  INTEGUMENTARY: No rashes. No lesions.  MUSCULOSKELETAL: Positive pain different areas of her body NEUROLOGIC: No numbness, tingling, or ataxia. No seizure-type activity.  PSYCHIATRIC: No anxiety. No insomnia. No ADD.    Vitals:   Vitals:   06/02/19 0100 06/02/19 0251 06/02/19 0755 06/02/19 1009  BP: (!) 191/81 (!) 182/78 (!) 174/85 (!) 160/76  Pulse: (!) 105 (!) 109 (!) 116 (!) 113  Resp: (!) 27 20 16 18   Temp:  98 F (36.7 C) 98.4 F (36.9 C)   TempSrc:   Oral   SpO2: 96% 94% 93% 93%  Weight:      Height:        Wt Readings from Last 3 Encounters:  06/01/19 62.6 kg  02/12/17 60.7 kg  01/22/17 60.8 kg    No intake or output data in the 24 hours ending 06/02/19 1420  Physical Exam:   GENERAL: Pleasant-appearing in no apparent distress.  HEAD, EYES, EARS,  NOSE AND THROAT: Atraumatic, normocephalic. Extraocular muscles are intact. Pupils equal and reactive to light. Sclerae anicteric. No conjunctival injection. No oro-pharyngeal erythema.  NECK: Supple. There is no jugular venous distention. No bruits, no lymphadenopathy, no thyromegaly.  HEART: Regular rate and rhythm,. No murmurs, no rubs, no clicks.  LUNGS: Clear to auscultation bilaterally. No rales or rhonchi. No wheezes.  ABDOMEN: Soft, flat, nontender, nondistended. Has good bowel sounds. No hepatosplenomegaly appreciated.  EXTREMITIES: No evidence of any cyanosis, clubbing, or peripheral edema.  +2 pedal and radial pulses bilaterally.  NEUROLOGIC: The patient is  alert, awake, and oriented x3 with no focal motor or sensory deficits appreciated bilaterally.  SKIN: Moist and warm with no rashes appreciated.  Psych: Not anxious, depressed LN: No inguinal LN enlargement    Antibiotics   Anti-infectives (From admission, onward)   None      Medications   Scheduled Meds: . amLODipine  2.5 mg Oral Daily  . [START ON 06/03/2019] enoxaparin (LOVENOX) injection  40 mg Subcutaneous Q24H  . escitalopram  20 mg Oral Daily  . loratadine  10 mg Oral Daily  . metFORMIN  1,000 mg Oral BID WC  . pantoprazole  40 mg Oral Daily  . simvastatin  40 mg Oral QHS  . sodium chloride flush  3 mL Intravenous Q12H  . traZODone  100 mg Oral QHS  . vitamin B-12  1,000 mcg Oral Daily   Continuous Infusions: PRN Meds:.acetaminophen **OR** acetaminophen, albuterol, morphine injection, ondansetron **OR** ondansetron (ZOFRAN) IV, oxyCODONE-acetaminophen   Data Review:   Micro Results No results found for this or any previous visit (from the past 240 hour(s)).  Radiology Reports Dg Chest 1 View  Result Date: 06/01/2019 CLINICAL DATA:  Post fall EXAM: CHEST  1 VIEW COMPARISON:  09/18/2015 FINDINGS: Grossly unchanged borderline enlarged cardiac silhouette and mediastinal contours with atherosclerotic plaque  within the thoracic aorta. Retrocardiac opacity compatible with known hiatal hernia. The lungs remain hyperexpanded with flattening of the diaphragms and mild diffuse slightly nodular thickening of the pulmonary interstitium. No discrete focal airspace opacities. No pleural effusion or pneumothorax. No evidence of edema. No definite acute osseous abnormalities. IMPRESSION: 1. Similar findings of borderline cardiomegaly, lung hyperexpansion and chronic bronchitic change without superimposed acute cardiopulmonary disease. 2. Hiatal hernia. Electronically Signed   By: Sandi Mariscal M.D.   On: 06/01/2019 21:39   Dg Wrist Complete Left  Result Date: 06/01/2019 CLINICAL DATA:  Post fall, now with left wrist pain. EXAM: LEFT WRIST - COMPLETE 3+ VIEW COMPARISON:  None. FINDINGS: Osteopenia. Mild diffuse soft tissue swelling about the wrist without associated displaced fracture or dislocation. Mild-to-moderate degenerative change involving the STT joints of the base of thumb with joint space loss, subchondral sclerosis osteophytosis. Remaining joint spaces appear preserved. No erosions. Suspected minimal amount of chondrocalcinosis within the TFCC. IMPRESSION: Mild diffuse soft tissue swelling about the wrist without associated fracture or dislocation. If the patient has pain referable to the anatomic snuff box, splinting and a follow-up radiographs in 10 to 14 days is recommended to evaluate for occult scaphoid fracture. Electronically Signed   By: Sandi Mariscal M.D.   On: 06/01/2019 21:37   Ct Head Wo Contrast  Result Date: 06/01/2019 CLINICAL DATA:  Fall, headache EXAM: CT HEAD WITHOUT CONTRAST CT CERVICAL SPINE WITHOUT CONTRAST TECHNIQUE: Multidetector CT imaging of the head and cervical spine was performed following the standard protocol without intravenous contrast. Multiplanar CT image reconstructions of the cervical spine were also generated. COMPARISON:  11/28/2015 FINDINGS: CT HEAD FINDINGS Brain: There is  atrophy and chronic small vessel disease changes. No acute intracranial abnormality. Specifically, no hemorrhage, hydrocephalus, mass lesion, acute infarction, or significant intracranial injury. Vascular: No hyperdense vessel or unexpected calcification. Skull: No acute calvarial abnormality. Sinuses/Orbits: Visualized paranasal sinuses and mastoids clear. Orbital soft tissues unremarkable. Other: None CT CERVICAL SPINE FINDINGS Alignment: 2 mm degenerative anterolisthesis of C4 on C5. Skull base and vertebrae: No acute fracture. No primary bone lesion or focal pathologic process. Soft tissues and spinal canal: No prevertebral fluid or swelling. No visible canal hematoma. Disc levels:  Diffuse degenerative disc and facet disease. Upper chest: No acute findings Other: Nodule in the right thyroid lobe measures 2 cm. IMPRESSION: Atrophy, chronic microvascular disease. No acute intracranial abnormality. Diffuse degenerative disc and facet disease. No acute bony abnormality. Electronically Signed   By: Rolm Baptise M.D.   On: 06/01/2019 21:23   Ct Cervical Spine Wo Contrast  Result Date: 06/01/2019 CLINICAL DATA:  Fall, headache EXAM: CT HEAD WITHOUT CONTRAST CT CERVICAL SPINE WITHOUT CONTRAST TECHNIQUE: Multidetector CT imaging of the head and cervical spine was performed following the standard protocol without intravenous contrast. Multiplanar CT image reconstructions of the cervical spine were also generated. COMPARISON:  11/28/2015 FINDINGS: CT HEAD FINDINGS Brain: There is atrophy and chronic small vessel disease changes. No acute intracranial abnormality. Specifically, no hemorrhage, hydrocephalus, mass lesion, acute infarction, or significant intracranial injury. Vascular: No hyperdense vessel or unexpected calcification. Skull: No acute calvarial abnormality. Sinuses/Orbits: Visualized paranasal sinuses and mastoids clear. Orbital soft tissues unremarkable. Other: None CT CERVICAL SPINE FINDINGS Alignment: 2  mm degenerative anterolisthesis of C4 on C5. Skull base and vertebrae: No acute fracture. No primary bone lesion or focal pathologic process. Soft tissues and spinal canal: No prevertebral fluid or swelling. No visible canal hematoma. Disc levels:  Diffuse degenerative disc and facet disease. Upper chest: No acute findings Other: Nodule in the right thyroid lobe measures 2 cm. IMPRESSION: Atrophy, chronic microvascular disease. No acute intracranial abnormality. Diffuse degenerative disc and facet disease. No acute bony abnormality. Electronically Signed   By: Rolm Baptise M.D.   On: 06/01/2019 21:23   Dg Shoulder Left  Result Date: 06/01/2019 CLINICAL DATA:  Post fall, now with left shoulder pain. EXAM: LEFT SHOULDER - 2+ VIEW COMPARISON:  None. FINDINGS: There is an acute vertically oriented minimally displaced fracture involving the greater tuberosity of the humeral head. No definitive intra-articular extension. Expected adjacent soft tissue swelling. No radiopaque foreign body. No additional fractures identified. Glenohumeral acromioclavicular joint spaces appear preserved. Linear calcifications regional to the rotator cuff insertion site as could be seen in the setting of calcific tendinitis. Limited visualization of the adjacent thorax is normal. IMPRESSION: Acute, vertically oriented, minimally displaced fracture of the greater tuberosity of the humeral head. Electronically Signed   By: Sandi Mariscal M.D.   On: 06/01/2019 21:34   Dg Hip Unilat With Pelvis 2-3 Views Left  Result Date: 06/01/2019 CLINICAL DATA:  Post fall, now with left hip pain. EXAM: DG HIP (WITH OR WITHOUT PELVIS) 2-3V LEFT COMPARISON:  None. FINDINGS: Acute, displaced fractures involving the left superior and inferior pubic rami without definitive extension to involve the pubic symphysis. No additional fractures identified. Mild degenerative change of the left hip with joint space loss, subchondral sclerosis and osteophytosis. No  evidence of avascular necrosis. Similar mild degenerative change of the contralateral right hip is suspected though incompletely evaluated. Expected adjacent soft tissue swelling.  No radiopaque foreign body. IMPRESSION: Acute, displaced fractures involving the left superior and inferior pubic rami without definitive extension to involve the pubic symphysis. Electronically Signed   By: Sandi Mariscal M.D.   On: 06/01/2019 21:36     CBC Recent Labs  Lab 06/01/19 2147 06/02/19 0432  WBC 11.5* 9.1  HGB 10.3* 9.2*  HCT 33.9* 31.3*  PLT 322 294  MCV 80.5 81.5  MCH 24.5* 24.0*  MCHC 30.4 29.4*  RDW 17.8* 17.8*  LYMPHSABS 1.4  --   MONOABS 0.9  --   EOSABS 0.1  --   BASOSABS 0.1  --  Chemistries  Recent Labs  Lab 06/01/19 2147 06/02/19 0432  NA 138 140  K 4.4 4.4  CL 105 106  CO2 23 25  GLUCOSE 192* 196*  BUN 17 18  CREATININE 0.67 0.56  CALCIUM 8.8* 8.6*   ------------------------------------------------------------------------------------------------------------------ estimated creatinine clearance is 39 mL/min (by C-G formula based on SCr of 0.56 mg/dL). ------------------------------------------------------------------------------------------------------------------ No results for input(s): HGBA1C in the last 72 hours. ------------------------------------------------------------------------------------------------------------------ No results for input(s): CHOL, HDL, LDLCALC, TRIG, CHOLHDL, LDLDIRECT in the last 72 hours. ------------------------------------------------------------------------------------------------------------------ No results for input(s): TSH, T4TOTAL, T3FREE, THYROIDAB in the last 72 hours.  Invalid input(s): FREET3 ------------------------------------------------------------------------------------------------------------------ No results for input(s): VITAMINB12, FOLATE, FERRITIN, TIBC, IRON, RETICCTPCT in the last 72 hours.  Coagulation  profile Recent Labs  Lab 06/01/19 2147  INR 1.0    No results for input(s): DDIMER in the last 72 hours.  Cardiac Enzymes No results for input(s): CKMB, TROPONINI, MYOGLOBIN in the last 168 hours.  Invalid input(s): CK ------------------------------------------------------------------------------------------------------------------ Invalid input(s): [place]  1.  Left humeral head fracture - Left shoulder sling in place -  Seen by Dr. Dagoberto Reef no plan for surgery - Pain is being managed with analgesic  2.  Left superior/inferior rami fracture -  Weightbearing as tolerated per orthopedic - Pain is being managed with analgesic - PT/OT consulted for supportive care during hospitalization - Social service consulted for likely rehab needs at the time of discharge  3.  Diabetes mellitus -  Sliding scale insulin   4.  Hypertension - Norvasc continue -We will treat persistent hypertension as needed  DVT and PPI prophylaxis      Code Status Orders  (From admission, onward)         Start     Ordered   06/02/19 0918  Do not attempt resuscitation (DNR)  Continuous    Question Answer Comment  In the event of cardiac or respiratory ARREST Do not call a "code blue"   In the event of cardiac or respiratory ARREST Do not perform Intubation, CPR, defibrillation or ACLS   In the event of cardiac or respiratory ARREST Use medication by any route, position, wound care, and other measures to relive pain and suffering. May use oxygen, suction and manual treatment of airway obstruction as needed for comfort.      06/02/19 0917        Code Status History    Date Active Date Inactive Code Status Order ID Comments User Context   06/02/2019 0016 06/02/2019 0917 Full Code [id number]  Mayer Camel, NP ED   11/28/2015 0801 11/30/2015 2005 DNR [id number]  Lance Coon, MD Inpatient   Advance Care Planning Activity           Consults  orthopedic  DVT Prophylaxis  Lovenox  Lab Results  Component Value Date   PLT 294 06/02/2019     Time Spent in minutes 35-minute  Greater than 50% of time spent in care coordination and counseling patient regarding the condition and plan of care.   Dustin Flock M.D on 06/02/2019 at 2:20 PM  Between 7am to 6pm - Pager - [phone]  After 6pm go to [email or web address] - Proofreader  [employer]   Office  [phone]

## 2019-06-02 NOTE — ED Notes (Signed)
ED TO INPATIENT HANDOFF REPORT  ED Nurse Name and Phone #:   Gershon Mussel RN  [phone]  S Name/Age/Gender Katrina Jefferson 85 y.o. female Room/Bed: ED25A/ED25A  Code Status   Code Status: Full Code  Home/SNF/Other Home Patient oriented to: self, place, time and situation Is this baseline? Yes   Triage Complete: Triage complete  Chief Complaint Fall L Shoulder Pain  Triage Note Pt to ED after a fall this evening. Pt ambulates with a walker and has a recent fall last week as well. pain to left shouldr, ribs and hip. Pain with palpation of all listed areas. Old bruise on left side of [place] that pt reports is from fall last week. Pt is alert and oriented x 4.    Allergies Allergies  Allergen Reactions  . Codeine Sulfate Other (See Comments)    Reaction:  Unknown   . Erythromycin Other (See Comments)    Reaction:  Unknown   . Sulfa Antibiotics Other (See Comments)    Reaction:  Unknown   . Ultracet [Tramadol-Acetaminophen] Other (See Comments)    Reaction:  Unknown     Level of Care/Admitting Diagnosis ED Disposition    ED Disposition Condition [place]: [place] [100120]  Level of Care: Med-Surg [16]  Covid Evaluation: Asymptomatic Screening Protocol (No Symptoms)  Diagnosis: Bilateral pubic rami fractures [id number]  Admitting Physician: Christel Mormon [id number]  Attending Physician: Christel Mormon [id number]  Estimated length of stay: past midnight tomorrow  Certification:: I certify this patient will need inpatient services for at least 2 midnights  PT Class (Do Not Modify): Inpatient [101]  PT Acc Code (Do Not Modify): Private [1]       B Medical/Surgery History Past Medical History:  Diagnosis Date  . Adrenal mass, left ([place])   . Arthritis   . COPD (chronic obstructive pulmonary disease) ([place])   . Depression   . Diabetes mellitus without complication ([place])   . Hyperlipidemia   . Insomnia   . Osteoporosis    . Retinal hemorrhage    Past Surgical History:  Procedure Laterality Date  . APPENDECTOMY    . BACK SURGERY    . CATARACT EXTRACTION, BILATERAL    . ESOPHAGOGASTRODUODENOSCOPY (EGD) WITH PROPOFOL N/A 09/17/2015   Procedure: ESOPHAGOGASTRODUODENOSCOPY (EGD) WITH PROPOFOL;  Surgeon: Josefine Class, MD;  Location: [facility] ENDOSCOPY;  Service: Endoscopy;  Laterality: N/A;  . GALLBLADDER SURGERY    . TONSILLECTOMY       A IV Location/Drains/Wounds Patient Lines/Drains/Airways Status   Active Line/Drains/Airways    Name:   Placement date:   Placement time:   Site:   Days:   Peripheral IV 06/01/19 Right Antecubital   06/01/19    2148    Antecubital   1          Intake/Output Last 24 hours No intake or output data in the 24 hours ending 06/02/19 0122  Labs/Imaging Results for orders placed or performed during the hospital encounter of 06/01/19 (from the past 48 hour(s))  CBC with Differential     Status: Abnormal   Collection Time: 06/01/19  9:47 PM  Result Value Ref Range   WBC 11.5 (H) 4.0 - 10.5 K/uL   RBC 4.21 3.87 - 5.11 MIL/uL   Hemoglobin 10.3 (L) 12.0 - 15.0 g/dL   HCT 33.9 (L) 36.0 - 46.0 %   MCV 80.5 80.0 - 100.0 fL   MCH 24.5 (L) 26.0 - 34.0 pg  MCHC 30.4 30.0 - 36.0 g/dL   RDW 17.8 (H) 11.5 - 15.5 %   Platelets 322 150 - 400 K/uL   nRBC 0.0 0.0 - 0.2 %   Neutrophils Relative % 78 %   Neutro Abs 9.0 (H) 1.7 - 7.7 K/uL   Lymphocytes Relative 12 %   Lymphs Abs 1.4 0.7 - 4.0 K/uL   Monocytes Relative 8 %   Monocytes Absolute 0.9 0.1 - 1.0 K/uL   Eosinophils Relative 1 %   Eosinophils Absolute 0.1 0.0 - 0.5 K/uL   Basophils Relative 0 %   Basophils Absolute 0.1 0.0 - 0.1 K/uL   Immature Granulocytes 1 %   Abs Immature Granulocytes 0.14 (H) 0.00 - 0.07 K/uL    Comment: Performed at [facility], [address] [place], [place] [id number]  Basic metabolic panel     Status: Abnormal   Collection Time: 06/01/19  9:47 PM  Result Value Ref Range    Sodium 138 135 - 145 mmol/L   Potassium 4.4 3.5 - 5.1 mmol/L   Chloride 105 98 - 111 mmol/L   CO2 23 22 - 32 mmol/L   Glucose, Bld 192 (H) 70 - 99 mg/dL   BUN 17 8 - 23 mg/dL   Creatinine, Ser 0.67 0.44 - 1.00 mg/dL   Calcium 8.8 (L) 8.9 - 10.3 mg/dL   GFR calc non Af Amer >60 >60 mL/min   GFR calc Af Amer >60 >60 mL/min   Anion gap 10 5 - 15    Comment: Performed at [facility], [address] [place], [place] [address]  Protime-INR     Status: None   Collection Time: 06/01/19  9:47 PM  Result Value Ref Range   Prothrombin Time 12.7 11.4 - 15.2 seconds   INR 1.0 0.8 - 1.2    Comment: (NOTE) INR goal varies based on device and disease states. Performed at [facility], [address] [place], [place] [address]   APTT     Status: None   Collection Time: 06/01/19  9:47 PM  Result Value Ref Range   aPTT 26 24 - 36 seconds    Comment: Performed at [facility], [address] [place], [place] [address]   Dg Chest 1 View  Result Date: 06/01/2019 CLINICAL DATA:  Post fall EXAM: CHEST  1 VIEW COMPARISON:  09/18/2015 FINDINGS: Grossly unchanged borderline enlarged cardiac silhouette and mediastinal contours with atherosclerotic plaque within the thoracic aorta. Retrocardiac opacity compatible with known hiatal hernia. The lungs remain hyperexpanded with flattening of the diaphragms and mild diffuse slightly nodular thickening of the pulmonary interstitium. No discrete focal airspace opacities. No pleural effusion or pneumothorax. No evidence of edema. No definite acute osseous abnormalities. IMPRESSION: 1. Similar findings of borderline cardiomegaly, lung hyperexpansion and chronic bronchitic change without superimposed acute cardiopulmonary disease. 2. Hiatal hernia. Electronically Signed   By: Sandi Mariscal M.D.   On: 06/01/2019 21:39   Dg Wrist Complete Left  Result Date: 06/01/2019 CLINICAL DATA:  Post fall, now with left wrist pain. EXAM: LEFT WRIST -  COMPLETE 3+ VIEW COMPARISON:  None. FINDINGS: Osteopenia. Mild diffuse soft tissue swelling about the wrist without associated displaced fracture or dislocation. Mild-to-moderate degenerative change involving the STT joints of the base of thumb with joint space loss, subchondral sclerosis osteophytosis. Remaining joint spaces appear preserved. No erosions. Suspected minimal amount of chondrocalcinosis within the TFCC. IMPRESSION: Mild diffuse soft tissue swelling about the wrist without associated fracture or dislocation.  If the patient has pain referable to the anatomic snuff box, splinting and a follow-up radiographs in 10 to 14 days is recommended to evaluate for occult scaphoid fracture. Electronically Signed   By: Sandi Mariscal M.D.   On: 06/01/2019 21:37   Ct Head Wo Contrast  Result Date: 06/01/2019 CLINICAL DATA:  Fall, headache EXAM: CT HEAD WITHOUT CONTRAST CT CERVICAL SPINE WITHOUT CONTRAST TECHNIQUE: Multidetector CT imaging of the head and cervical spine was performed following the standard protocol without intravenous contrast. Multiplanar CT image reconstructions of the cervical spine were also generated. COMPARISON:  11/28/2015 FINDINGS: CT HEAD FINDINGS Brain: There is atrophy and chronic small vessel disease changes. No acute intracranial abnormality. Specifically, no hemorrhage, hydrocephalus, mass lesion, acute infarction, or significant intracranial injury. Vascular: No hyperdense vessel or unexpected calcification. Skull: No acute calvarial abnormality. Sinuses/Orbits: Visualized paranasal sinuses and mastoids clear. Orbital soft tissues unremarkable. Other: None CT CERVICAL SPINE FINDINGS Alignment: 2 mm degenerative anterolisthesis of C4 on C5. Skull base and vertebrae: No acute fracture. No primary bone lesion or focal pathologic process. Soft tissues and spinal canal: No prevertebral fluid or swelling. No visible canal hematoma. Disc levels:  Diffuse degenerative disc and facet disease.  Upper chest: No acute findings Other: Nodule in the right thyroid lobe measures 2 cm. IMPRESSION: Atrophy, chronic microvascular disease. No acute intracranial abnormality. Diffuse degenerative disc and facet disease. No acute bony abnormality. Electronically Signed   By: Rolm Baptise M.D.   On: 06/01/2019 21:23   Ct Cervical Spine Wo Contrast  Result Date: 06/01/2019 CLINICAL DATA:  Fall, headache EXAM: CT HEAD WITHOUT CONTRAST CT CERVICAL SPINE WITHOUT CONTRAST TECHNIQUE: Multidetector CT imaging of the head and cervical spine was performed following the standard protocol without intravenous contrast. Multiplanar CT image reconstructions of the cervical spine were also generated. COMPARISON:  11/28/2015 FINDINGS: CT HEAD FINDINGS Brain: There is atrophy and chronic small vessel disease changes. No acute intracranial abnormality. Specifically, no hemorrhage, hydrocephalus, mass lesion, acute infarction, or significant intracranial injury. Vascular: No hyperdense vessel or unexpected calcification. Skull: No acute calvarial abnormality. Sinuses/Orbits: Visualized paranasal sinuses and mastoids clear. Orbital soft tissues unremarkable. Other: None CT CERVICAL SPINE FINDINGS Alignment: 2 mm degenerative anterolisthesis of C4 on C5. Skull base and vertebrae: No acute fracture. No primary bone lesion or focal pathologic process. Soft tissues and spinal canal: No prevertebral fluid or swelling. No visible canal hematoma. Disc levels:  Diffuse degenerative disc and facet disease. Upper chest: No acute findings Other: Nodule in the right thyroid lobe measures 2 cm. IMPRESSION: Atrophy, chronic microvascular disease. No acute intracranial abnormality. Diffuse degenerative disc and facet disease. No acute bony abnormality. Electronically Signed   By: Rolm Baptise M.D.   On: 06/01/2019 21:23   Dg Shoulder Left  Result Date: 06/01/2019 CLINICAL DATA:  Post fall, now with left shoulder pain. EXAM: LEFT SHOULDER - 2+  VIEW COMPARISON:  None. FINDINGS: There is an acute vertically oriented minimally displaced fracture involving the greater tuberosity of the humeral head. No definitive intra-articular extension. Expected adjacent soft tissue swelling. No radiopaque foreign body. No additional fractures identified. Glenohumeral acromioclavicular joint spaces appear preserved. Linear calcifications regional to the rotator cuff insertion site as could be seen in the setting of calcific tendinitis. Limited visualization of the adjacent thorax is normal. IMPRESSION: Acute, vertically oriented, minimally displaced fracture of the greater tuberosity of the humeral head. Electronically Signed   By: Sandi Mariscal M.D.   On: 06/01/2019 21:34   Dg Hip Unilat  With Pelvis 2-3 Views Left  Result Date: 06/01/2019 CLINICAL DATA:  Post fall, now with left hip pain. EXAM: DG HIP (WITH OR WITHOUT PELVIS) 2-3V LEFT COMPARISON:  None. FINDINGS: Acute, displaced fractures involving the left superior and inferior pubic rami without definitive extension to involve the pubic symphysis. No additional fractures identified. Mild degenerative change of the left hip with joint space loss, subchondral sclerosis and osteophytosis. No evidence of avascular necrosis. Similar mild degenerative change of the contralateral right hip is suspected though incompletely evaluated. Expected adjacent soft tissue swelling.  No radiopaque foreign body. IMPRESSION: Acute, displaced fractures involving the left superior and inferior pubic rami without definitive extension to involve the pubic symphysis. Electronically Signed   By: Sandi Mariscal M.D.   On: 06/01/2019 21:36    Pending Labs Unresulted Labs (From admission, onward)    Start     Ordered   06/09/19 0500  Creatinine, serum  (enoxaparin (LOVENOX)    CrCl >/= 30 ml/min)  Weekly,   STAT    Comments: while on enoxaparin therapy    06/02/19 0016   06/02/19 [id number]  Basic metabolic panel  Tomorrow morning,   STAT      06/02/19 0016   06/02/19 0500  CBC  Tomorrow morning,   STAT     06/02/19 0016   06/02/19 0017  CBC  (enoxaparin (LOVENOX)    CrCl >/= 30 ml/min)  Once,   STAT    Comments: Baseline for enoxaparin therapy IF NOT ALREADY DRAWN.  Notify MD if PLT < 100 K.    06/02/19 0016   06/02/19 0017  Creatinine, serum  (enoxaparin (LOVENOX)    CrCl >/= 30 ml/min)  Once,   STAT    Comments: Baseline for enoxaparin therapy IF NOT ALREADY DRAWN.    06/02/19 0016   06/01/19 2052  Urinalysis, Complete w Microscopic  ONCE - STAT,   STAT     06/01/19 2051          Vitals/Pain Today's Vitals   06/01/19 2330 06/02/19 0000 06/02/19 0030 06/02/19 0100  BP: (!) 196/86 (!) 212/97 (!) 180/93 (!) 191/81  Pulse: 97 (!) 102 (!) 107 (!) 105  Resp: (!) 23 (!) 22 (!) 27 (!) 27  Temp:      TempSrc:      SpO2: 97% 99% 94% 96%  Weight:      Height:      PainSc:        Isolation Precautions No active isolations  Medications Medications  albuterol (PROVENTIL) (2.5 MG/3ML) 0.083% nebulizer solution 2.5 mg (has no administration in time range)  amLODipine (NORVASC) tablet 2.5 mg (has no administration in time range)  escitalopram (LEXAPRO) tablet 20 mg (has no administration in time range)  loratadine (CLARITIN) tablet 10 mg (has no administration in time range)  metFORMIN (GLUCOPHAGE) tablet 1,000 mg (has no administration in time range)  pantoprazole (PROTONIX) EC tablet 40 mg (has no administration in time range)  simvastatin (ZOCOR) tablet 40 mg (has no administration in time range)  traZODone (DESYREL) tablet 100 mg (has no administration in time range)  vitamin B-12 (CYANOCOBALAMIN) tablet 1,000 mcg (has no administration in time range)  enoxaparin (LOVENOX) injection 40 mg (has no administration in time range)  sodium chloride flush (NS) 0.9 % injection 3 mL (has no administration in time range)  acetaminophen (TYLENOL) tablet 650 mg (has no administration in time range)    Or  acetaminophen  (TYLENOL) suppository 650 mg (has no administration in  time range)  ondansetron (ZOFRAN) tablet 4 mg (has no administration in time range)    Or  ondansetron (ZOFRAN) injection 4 mg (has no administration in time range)  fentaNYL (SUBLIMAZE) injection 25 mcg (25 mcg Intravenous Given 06/01/19 2158)  morphine 2 MG/ML injection 2 mg (2 mg Intravenous Given 06/01/19 2344)  HYDROmorphone (DILAUDID) injection 1 mg (1 mg Intravenous Given 06/02/19 0107)    Mobility walks with device High fall risk   Focused Assessments Cardiac Assessment Handoff:    Lab Results  Component Value Date   TROPONINI <0.03 12/31/2016   No results found for: DDIMER Does the Patient currently have chest pain? No     R Recommendations: See Admitting Provider Note  Report given to:   Additional Notes:

## 2019-06-02 NOTE — TOC Progression Note (Signed)
Transition of Care [facility]) - Progression Note    Patient Details  Name: Katrina Jefferson MRN: [id number] Date of Birth: 1924-09-06  [facility]) CM/SW Contact  Su Hilt, RN Phone Number: 06/02/2019, 3:09 PM  Clinical Narrative:    Spoke to the Niece [name] and she agrees to do a bed search for rehab.  Will send and review once bed offers are obtained        Expected Discharge Plan and Services                                                 Social Determinants of Health (SDOH) Interventions    Readmission Risk Interventions No flowsheet data found.

## 2019-06-02 NOTE — NC FL2 (Signed)
[place] LEVEL OF CARE SCREENING TOOL     IDENTIFICATION  Patient Name: Katrina Jefferson Birthdate: 22-Jul-1924 Sex: female Admission Date (Current Location): 06/01/2019  [place] and [place] Number:  Engineering geologist and Address:  [facility], [address], [place], [place] [address]      Provider Number: [id number]  Attending Physician Name and Address:  Dustin Flock, MD  Relative Name and Phone Number:  [name] [phone]    Current Level of Care: Hospital Recommended Level of Care: [place] Prior Approval Number:    Date Approved/Denied:   PASRR Number: [id number]  Discharge Plan: SNF    Current Diagnoses: Patient Active Problem List   Diagnosis Date Noted  . Bilateral pubic rami fractures ([place]) 06/02/2019  . Frequent falls 11/28/2015  . COPD (chronic obstructive pulmonary disease) ([place]) 11/28/2015  . HLD (hyperlipidemia) 11/28/2015  . Accelerated hypertension 11/28/2015  . Falls 11/28/2015    Orientation RESPIRATION BLADDER Height & Weight     Self, Situation, Place  Normal Continent Weight: 62.6 kg Height:  5\' 2"  (157.5 cm)  BEHAVIORAL SYMPTOMS/MOOD NEUROLOGICAL BOWEL NUTRITION STATUS      Continent    AMBULATORY STATUS COMMUNICATION OF NEEDS Skin   Extensive Assist Verbally Surgical wounds, Bruising                       Personal Care Assistance Level of Assistance  Bathing, Dressing Bathing Assistance: Limited assistance   Dressing Assistance: Limited assistance     Functional Limitations Info  Sight, Hearing, Speech Sight Info: Adequate Hearing Info: Adequate Speech Info: Adequate    SPECIAL CARE FACTORS FREQUENCY                       Contractures Contractures Info: Not present    Additional Factors Info  Code Status, Allergies Code Status Info: Full Allergies Info: Codeine Sulfate, Erythromycin, Sulfa Antibiotics, Ultracet           Current  Medications (06/02/2019):  This is the current hospital active medication list Current Facility-Administered Medications  Medication Dose Route Frequency Provider Last Rate Last Dose  . acetaminophen (TYLENOL) tablet 650 mg  650 mg Oral Q6H PRN Seals, Theo Dills, NP       Or  . acetaminophen (TYLENOL) suppository 650 mg  650 mg Rectal Q6H PRN Seals, Levada Dy H, NP      . albuterol (PROVENTIL) (2.5 MG/3ML) 0.083% nebulizer solution 2.5 mg  2.5 mg Inhalation Q4H PRN Seals, Angela H, NP      . amLODipine (NORVASC) tablet 2.5 mg  2.5 mg Oral Daily Seals, Angela H, NP   2.5 mg at 06/02/19 0831  . enoxaparin (LOVENOX) injection 40 mg  40 mg Subcutaneous Q24H Seals, Angela H, NP      . escitalopram (LEXAPRO) tablet 20 mg  20 mg Oral Daily Seals, Angela H, NP   20 mg at 06/02/19 [id number]  . loratadine (CLARITIN) tablet 10 mg  10 mg Oral Daily Seals, Theo Dills, NP   10 mg at 06/02/19 [id number]  . metFORMIN (GLUCOPHAGE) tablet 1,000 mg  1,000 mg Oral BID WC Seals, Angela H, NP   1,000 mg at 06/02/19 0831  . ondansetron (ZOFRAN) tablet 4 mg  4 mg Oral Q6H PRN Seals, Theo Dills, NP       Or  . ondansetron (ZOFRAN) injection 4 mg  4 mg Intravenous Q6H PRN Seals, Theo Dills, NP      .  pantoprazole (PROTONIX) EC tablet 40 mg  40 mg Oral Daily Seals, Theo Dills, NP   40 mg at 06/02/19 [id number]  . simvastatin (ZOCOR) tablet 40 mg  40 mg Oral QHS Seals, Angela H, NP      . sodium chloride flush (NS) 0.9 % injection 3 mL  3 mL Intravenous Q12H Seals, Angela H, NP   3 mL at 06/02/19 [id number]  . traZODone (DESYREL) tablet 100 mg  100 mg Oral QHS Seals, Angela H, NP      . vitamin B-12 (CYANOCOBALAMIN) tablet 1,000 mcg  1,000 mcg Oral Daily Seals, Theo Dills, NP   1,000 mcg at 06/02/19 [id number]     Discharge Medications: Please see discharge summary for a list of discharge medications.  Relevant Imaging Results:  Relevant Lab Results:   Additional Information [id number]  Su Hilt, RN

## 2019-06-02 NOTE — Progress Notes (Signed)
PT Cancellation Note  Patient Details Name: Katrina Jefferson MRN: [id number] DOB: Jul 15, 1924   Cancelled Treatment:    Reason Eval/Treat Not Completed: Medical issues which prohibited therapy(CHart reviewed. RN suggests holding PT evaluation until pt's HR and pain are more adequately managed.) Will follow acutely and evaluate once appropriate.   1:43 PM, 06/02/19 Etta Grandchild, PT, DPT Physical Therapist - [facility]  [phone] ([place])     [place] C 06/02/2019, 1:43 PM

## 2019-06-02 NOTE — H&P (Signed)
[facility] NAME: Katrina Jefferson    MR#:  [id number]  DATE OF BIRTH:  1924-08-29  DATE OF ADMISSION:  06/01/2019  PRIMARY CARE PHYSICIAN: Glendon Axe, MD   REQUESTING/REFERRING PHYSICIAN: Derrell Lolling, MD  CHIEF COMPLAINT:   Chief Complaint  Patient presents with   Fall   Shoulder Pain    HISTORY OF PRESENT ILLNESS:  Katrina Jefferson  is a 85 y.o. female with a known history of arthritis, COPD, diabetes mellitus, osteoporosis, hyperlipidemia.  Patient continues to live at home.  She presented to the emergency room after falling at home.  She is uncertain what caused her fall.  She fell onto her left side while she was walking with her walker.  She denies dizziness, lightheadedness, syncope, chest pain, shortness of breath, fever, chills, nausea, vomiting, diarrhea.  She denies abdominal pain.  Her initial complaint was of her left hip and left shoulder.  She has an obvious bruise to her right frontal area.  However she denies hitting her head when she fell "this time ".  However her niece is at the bedside and reports she had a fall a week ago and likely hit her head at that time.  Patient is not on blood thinners.  X-rays demonstrate left humeral head and left superior and inferior rami fractures.  Dr. Mack Guise was consulted by the ED physician.  PAST MEDICAL HISTORY:   Past Medical History:  Diagnosis Date   Adrenal mass, left (HCC)    Arthritis    COPD (chronic obstructive pulmonary disease) ([place])    Depression    Diabetes mellitus without complication ([place])    Hyperlipidemia    Insomnia    Osteoporosis    Retinal hemorrhage     PAST SURGICAL HISTORY:   Past Surgical History:  Procedure Laterality Date   APPENDECTOMY     BACK SURGERY     CATARACT EXTRACTION, BILATERAL     ESOPHAGOGASTRODUODENOSCOPY (EGD) WITH PROPOFOL N/A 09/17/2015   Procedure: ESOPHAGOGASTRODUODENOSCOPY (EGD) WITH  PROPOFOL;  Surgeon: Josefine Class, MD;  Location: [facility] ENDOSCOPY;  Service: Endoscopy;  Laterality: N/A;   GALLBLADDER SURGERY     TONSILLECTOMY      SOCIAL HISTORY:   Social History   Tobacco Use   Smoking status: Current Every Day Smoker    Packs/day: 0.50   Smokeless tobacco: Never Used  Substance Use Topics   Alcohol use: No    FAMILY HISTORY:   Family History  Problem Relation Age of Onset   Heart attack Other    Diabetes Other    Dementia Other    Prostate cancer Neg Hx    Kidney cancer Neg Hx    Bladder Cancer Neg Hx     DRUG ALLERGIES:   Allergies  Allergen Reactions   Codeine Sulfate Other (See Comments)    Reaction:  Unknown    Erythromycin Other (See Comments)    Reaction:  Unknown    Sulfa Antibiotics Other (See Comments)    Reaction:  Unknown    Ultracet [Tramadol-Acetaminophen] Other (See Comments)    Reaction:  Unknown     REVIEW OF SYSTEMS:   Review of Systems  Constitutional: Negative for chills, fever and malaise/fatigue.  HENT: Negative for congestion, sinus pain and sore throat.   Eyes: Negative for blurred vision and double vision.  Respiratory: Negative for cough and shortness of breath.   Cardiovascular: Negative for chest pain and palpitations.  Gastrointestinal: Negative for  abdominal pain, heartburn, nausea and vomiting.  Genitourinary: Negative for dysuria and hematuria.  Musculoskeletal: Positive for falls and joint pain.  Skin: Negative for itching and rash.  Neurological: Negative for dizziness and weakness.  Psychiatric/Behavioral: Negative for depression.    MEDICATIONS AT HOME:   Prior to Admission medications   Medication Sig Start Date End Date Taking? Authorizing Provider  acetaminophen (TYLENOL) 500 MG tablet Take 1,000 mg by mouth every 6 (six) hours as needed for mild pain, fever or headache.   Yes [provider]  albuterol (VENTOLIN HFA) 108 (90 Base) MCG/ACT inhaler Inhale 2 puffs  into the lungs every 4 (four) hours as needed for wheezing or shortness of breath.   Yes [provider]  amLODipine (NORVASC) 2.5 MG tablet Take 2.5 mg by mouth daily.   Yes [provider]  escitalopram (LEXAPRO) 20 MG tablet Take 20 mg by mouth daily.    Yes [provider]  etodolac (LODINE XL) 500 MG 24 hr tablet Take 500 mg by mouth 2 (two) times daily as needed (pain).    Yes [provider]  HYDROcodone-acetaminophen (NORCO/VICODIN) 5-325 MG tablet Take 0.5-1 tablets by mouth at bedtime as needed for moderate pain.  11/17/16  Yes [provider]  loratadine (CLARITIN) 10 MG tablet Take 10 mg by mouth daily.   Yes [provider]  metFORMIN (GLUCOPHAGE) 1000 MG tablet Take 1,000 mg by mouth 2 (two) times daily with a meal.    Yes [provider]  omeprazole (PRILOSEC) 40 MG capsule Take 40 mg by mouth daily.   Yes [provider]  promethazine (PHENERGAN) 25 MG tablet Take 25 mg by mouth every 4 (four) hours as needed for nausea or vomiting.    Yes [provider]  simvastatin (ZOCOR) 40 MG tablet Take 40 mg by mouth at bedtime.    Yes [provider]  traZODone (DESYREL) 100 MG tablet Take 100 mg by mouth at bedtime.    Yes [provider]  vitamin B-12 (CYANOCOBALAMIN) 1000 MCG tablet Take 1,000 mcg by mouth daily.    Yes [provider]      VITAL SIGNS:  Blood pressure (!) 212/97, pulse (!) 102, temperature 98.2 F (36.8 C), temperature source Oral, resp. rate (!) 22, height 5\' 2"  (1.575 m), weight 62.6 kg, SpO2 99 %.  PHYSICAL EXAMINATION:  Physical Exam  GENERAL:  85 y.o.-year-old patient lying in the bed with no acute distress.  EYES: Pupils equal, round, reactive to light and accommodation. No scleral icterus. Extraocular muscles intact.  HEENT: Head atraumatic, normocephalic. Oropharynx and nasopharynx clear.  NECK:  Supple, no jugular venous distention. No thyroid  enlargement, no tenderness.  LUNGS: Normal breath sounds bilaterally, no wheezing, rales,rhonchi or crepitation. No use of accessory muscles of respiration.  CARDIOVASCULAR: Regular rate and rhythm, S1, S2 normal. No murmurs, rubs, or gallops.  ABDOMEN: Soft, nondistended, nontender. Bowel sounds present. No organomegaly or mass.  EXTREMITIES: No pedal edema, cyanosis, or clubbing. Left upper extremity/shoulder pain with ROM. Left hip tenderness NEUROLOGIC: Cranial nerves II through XII are intact. Muscle strength 5/5 in all extremities. Sensation intact. Gait not checked.  PSYCHIATRIC: The patient is alert and oriented x 3.  Normal affect and good eye contact. SKIN: No obvious rash, lesion, or ulcer.   LABORATORY PANEL:   CBC Recent Labs  Lab 06/01/19 2147  WBC 11.5*  HGB 10.3*  HCT 33.9*  PLT 322   ------------------------------------------------------------------------------------------------------------------  Chemistries  Recent Labs  Lab 06/01/19 2147  NA 138  K 4.4  CL 105  CO2 23  GLUCOSE 192*  BUN 17  CREATININE 0.67  CALCIUM 8.8*   ------------------------------------------------------------------------------------------------------------------  Cardiac Enzymes No results for input(s): TROPONINI in the last 168 hours. ------------------------------------------------------------------------------------------------------------------  RADIOLOGY:  Dg Chest 1 View  Result Date: 06/01/2019 CLINICAL DATA:  Post fall EXAM: CHEST  1 VIEW COMPARISON:  09/18/2015 FINDINGS: Grossly unchanged borderline enlarged cardiac silhouette and mediastinal contours with atherosclerotic plaque within the thoracic aorta. Retrocardiac opacity compatible with known hiatal hernia. The lungs remain hyperexpanded with flattening of the diaphragms and mild diffuse slightly nodular thickening of the pulmonary interstitium. No discrete focal airspace opacities. No pleural effusion or  pneumothorax. No evidence of edema. No definite acute osseous abnormalities. IMPRESSION: 1. Similar findings of borderline cardiomegaly, lung hyperexpansion and chronic bronchitic change without superimposed acute cardiopulmonary disease. 2. Hiatal hernia. Electronically Signed   By: Sandi Mariscal M.D.   On: 06/01/2019 21:39   Dg Wrist Complete Left  Result Date: 06/01/2019 CLINICAL DATA:  Post fall, now with left wrist pain. EXAM: LEFT WRIST - COMPLETE 3+ VIEW COMPARISON:  None. FINDINGS: Osteopenia. Mild diffuse soft tissue swelling about the wrist without associated displaced fracture or dislocation. Mild-to-moderate degenerative change involving the STT joints of the base of thumb with joint space loss, subchondral sclerosis osteophytosis. Remaining joint spaces appear preserved. No erosions. Suspected minimal amount of chondrocalcinosis within the TFCC. IMPRESSION: Mild diffuse soft tissue swelling about the wrist without associated fracture or dislocation. If the patient has pain referable to the anatomic snuff box, splinting and a follow-up radiographs in 10 to 14 days is recommended to evaluate for occult scaphoid fracture. Electronically Signed   By: Sandi Mariscal M.D.   On: 06/01/2019 21:37   Ct Head Wo Contrast  Result Date: 06/01/2019 CLINICAL DATA:  Fall, headache EXAM: CT HEAD WITHOUT CONTRAST CT CERVICAL SPINE WITHOUT CONTRAST TECHNIQUE: Multidetector CT imaging of the head and cervical spine was performed following the standard protocol without intravenous contrast. Multiplanar CT image reconstructions of the cervical spine were also generated. COMPARISON:  11/28/2015 FINDINGS: CT HEAD FINDINGS Brain: There is atrophy and chronic small vessel disease changes. No acute intracranial abnormality. Specifically, no hemorrhage, hydrocephalus, mass lesion, acute infarction, or significant intracranial injury. Vascular: No hyperdense vessel or unexpected calcification. Skull: No acute calvarial  abnormality. Sinuses/Orbits: Visualized paranasal sinuses and mastoids clear. Orbital soft tissues unremarkable. Other: None CT CERVICAL SPINE FINDINGS Alignment: 2 mm degenerative anterolisthesis of C4 on C5. Skull base and vertebrae: No acute fracture. No primary bone lesion or focal pathologic process. Soft tissues and spinal canal: No prevertebral fluid or swelling. No visible canal hematoma. Disc levels:  Diffuse degenerative disc and facet disease. Upper chest: No acute findings Other: Nodule in the right thyroid lobe measures 2 cm. IMPRESSION: Atrophy, chronic microvascular disease. No acute intracranial abnormality. Diffuse degenerative disc and facet disease. No acute bony abnormality. Electronically Signed   By: Rolm Baptise M.D.   On: 06/01/2019 21:23   Ct Cervical Spine Wo Contrast  Result Date: 06/01/2019 CLINICAL DATA:  Fall, headache EXAM: CT HEAD WITHOUT CONTRAST CT CERVICAL SPINE WITHOUT CONTRAST TECHNIQUE: Multidetector CT imaging of the head and cervical spine was performed following the standard protocol without intravenous contrast. Multiplanar CT image reconstructions of the cervical spine were also generated. COMPARISON:  11/28/2015 FINDINGS: CT HEAD FINDINGS Brain: There is atrophy and chronic small vessel disease changes. No acute intracranial abnormality. Specifically, no hemorrhage, hydrocephalus, mass lesion, acute  infarction, or significant intracranial injury. Vascular: No hyperdense vessel or unexpected calcification. Skull: No acute calvarial abnormality. Sinuses/Orbits: Visualized paranasal sinuses and mastoids clear. Orbital soft tissues unremarkable. Other: None CT CERVICAL SPINE FINDINGS Alignment: 2 mm degenerative anterolisthesis of C4 on C5. Skull base and vertebrae: No acute fracture. No primary bone lesion or focal pathologic process. Soft tissues and spinal canal: No prevertebral fluid or swelling. No visible canal hematoma. Disc levels:  Diffuse degenerative disc and  facet disease. Upper chest: No acute findings Other: Nodule in the right thyroid lobe measures 2 cm. IMPRESSION: Atrophy, chronic microvascular disease. No acute intracranial abnormality. Diffuse degenerative disc and facet disease. No acute bony abnormality. Electronically Signed   By: Rolm Baptise M.D.   On: 06/01/2019 21:23   Dg Shoulder Left  Result Date: 06/01/2019 CLINICAL DATA:  Post fall, now with left shoulder pain. EXAM: LEFT SHOULDER - 2+ VIEW COMPARISON:  None. FINDINGS: There is an acute vertically oriented minimally displaced fracture involving the greater tuberosity of the humeral head. No definitive intra-articular extension. Expected adjacent soft tissue swelling. No radiopaque foreign body. No additional fractures identified. Glenohumeral acromioclavicular joint spaces appear preserved. Linear calcifications regional to the rotator cuff insertion site as could be seen in the setting of calcific tendinitis. Limited visualization of the adjacent thorax is normal. IMPRESSION: Acute, vertically oriented, minimally displaced fracture of the greater tuberosity of the humeral head. Electronically Signed   By: Sandi Mariscal M.D.   On: 06/01/2019 21:34   Dg Hip Unilat With Pelvis 2-3 Views Left  Result Date: 06/01/2019 CLINICAL DATA:  Post fall, now with left hip pain. EXAM: DG HIP (WITH OR WITHOUT PELVIS) 2-3V LEFT COMPARISON:  None. FINDINGS: Acute, displaced fractures involving the left superior and inferior pubic rami without definitive extension to involve the pubic symphysis. No additional fractures identified. Mild degenerative change of the left hip with joint space loss, subchondral sclerosis and osteophytosis. No evidence of avascular necrosis. Similar mild degenerative change of the contralateral right hip is suspected though incompletely evaluated. Expected adjacent soft tissue swelling.  No radiopaque foreign body. IMPRESSION: Acute, displaced fractures involving the left superior and  inferior pubic rami without definitive extension to involve the pubic symphysis. Electronically Signed   By: Sandi Mariscal M.D.   On: 06/01/2019 21:36      IMPRESSION AND PLAN:   1.  Left humeral head fracture - Left shoulder sling in place - Dr. Krasinski/orthopedic consulted by the ED physician - Pain is being managed with analgesic  2.  Left superior/inferior rami fracture - Again, orthopedic aware and planning to see patient in the a.m. - Pain is being managed with analgesic - PT/OT consulted for supportive care during hospitalization - Social service consulted for likely rehab needs at the time of discharge  3.  Diabetes mellitus - Moderate sliding scale insulin -Hemoglobin A1c  4.  Hypertension - Norvasc continue -We will treat persistent hypertension as needed  DVT and PPI prophylaxis    All the records are reviewed and case discussed with ED provider. The plan of care was discussed in details with the patient (and family). I answered all questions. The patient agreed to proceed with the above mentioned plan. Further management will depend upon hospital course.   CODE STATUS: Full code  TOTAL TIME TAKING CARE OF THIS PATIENT:45 minutes.    Crary 06/02/2019 at 12:54 AM  Pager - [phone]  After 6pm go to [email or web address] - [facility]  [facility]  Office  [phone]  CC: Primary care physician; Glendon Axe, MD   Note: This dictation was prepared with Dragon dictation along with smaller phrase technology. Any transcriptional errors that result from this process are unintentional.

## 2019-06-03 ENCOUNTER — Inpatient Hospital Stay: Payer: Medicare Other

## 2019-06-03 LAB — SARS CORONAVIRUS 2 (TAT 6-24 HRS): SARS Coronavirus 2: NEGATIVE

## 2019-06-03 LAB — GLUCOSE, CAPILLARY
Glucose-Capillary: 183 mg/dL — ABNORMAL HIGH (ref 70–99)
Glucose-Capillary: 188 mg/dL — ABNORMAL HIGH (ref 70–99)
Glucose-Capillary: 150 mg/dL — ABNORMAL HIGH (ref 70–99)
Glucose-Capillary: 131 mg/dL — ABNORMAL HIGH (ref 70–99)

## 2019-06-03 MED ORDER — HYDRALAZINE HCL 20 MG/ML IJ SOLN: 10.0000 mg | Freq: Four times a day (QID) | INTRAMUSCULAR | Status: DC | PRN

## 2019-06-03 MED ORDER — PROMETHAZINE HCL 25 MG/ML IJ SOLN
12.5000 mg | Freq: Four times a day (QID) | INTRAMUSCULAR | Status: DC | PRN
  Filled 2019-06-03: qty 1

## 2019-06-03 MED ORDER — METOPROLOL TARTRATE 5 MG/5ML IV SOLN
5.0000 mg | Freq: Once | INTRAVENOUS | Status: AC
  Administered 2019-06-03: 5 mg via INTRAVENOUS
  Filled 2019-06-03 (×2): qty 5

## 2019-06-03 MED ORDER — LABETALOL HCL 5 MG/ML IV SOLN: 20.0000 mg | INTRAVENOUS | Status: DC | PRN

## 2019-06-03 NOTE — Progress Notes (Addendum)
MEWS Guidelines - (patients age 83 and over) Pt is asymptomatic. Monitoring pt per MEWS protocol. No acute distress noted. Will continue to monitor patient  Yellow - At risk for Deterioration   1. Go to room and assess patient 2. Validate data. Is this patient's baseline? If data confirmed: 3. Is this an acute change? 4. Administer prn meds/treatments as ordered? 5. Note Sepsis score 6. Review goals of care 7. Sports coach and Provider 8. Call RRT nurse as needed. 9. Document patient condition/interventions/response. 10. Increase frequency of vital signs and focused assessments to at least q2h x2. - If stable, then q4h x2 and then q8h or dept. routine. - If unstable, contact Provider & RRT nurse. Prepare for possible transfer. 11. Add entry in progress notes using the smart phrase ".MEWS".  [place] Only

## 2019-06-03 NOTE — Progress Notes (Signed)
PT Cancellation Note  Patient Details Name: Katrina Jefferson MRN: [id number] DOB: 1924-09-13   Cancelled Treatment:    Reason Eval/Treat Not Completed: Other (comment). Pt with elevated vitals with resting HR at 110 and BP 178/80. Pt also complains of vomiting throughout the morning per RN. Discussed with RN. Not appropriate for PT evaluation this morning, will re-attempt in PM if pt appropriate.   Karynn Deblasi 06/03/2019, 10:37 AM Greggory Stallion, PT, DPT [phone]

## 2019-06-03 NOTE — Evaluation (Signed)
Physical Therapy Evaluation Patient Details Name: Katrina Jefferson MRN: [id number] DOB: January 28, 1924 Today's Date: 06/03/2019   History of Present Illness  Pt admitted for B pubic rami fractures along with L shoulder greater tuberosity fx secondary to fall at home. History includes COPD, depression, DM, and hyperlipidemia.   Clinical Impression  Pt is a pleasant 85 year old female who was admitted for B pubic rami fractures and L shoulder fx. Pt unable to perform OOB this date due to cognition and pain. Attempted there-ex on B LEs, limited tolerance. O2 sats decreased to 88% on 2L of O2, deferred further treatment as pt has difficulty following commands for pursed lip breathing. Has been limited by dry heaving and vomiting this AM. Pt very lethargic this PM. Pt demonstrates deficits with strength/cognition/mobility. Pt is currently not at baseline level. Would benefit from skilled PT to address above deficits and promote optimal return to PLOF; recommend transition to STR upon discharge from acute hospitalization.     Follow Up Recommendations SNF    Equipment Recommendations  (TBD)    Recommendations for Other Services       Precautions / Restrictions Precautions Precautions: Fall Restrictions Weight Bearing Restrictions: Yes Other Position/Activity Restrictions: may use L UE on RW, but avoid lift, forward flexion, abduction. WBAT on B LE      Mobility  Bed Mobility               General bed mobility comments: unable to perform due to congition  Transfers                    Ambulation/Gait                Stairs            Wheelchair Mobility    Modified Rankin (Stroke Patients Only)       Balance Overall balance assessment: History of Falls                                           Pertinent Vitals/Pain Pain Assessment: Faces Faces Pain Scale: Hurts a little bit Pain Location: B LE with movement Pain Descriptors /  Indicators: Discomfort;Dull;Grimacing;Guarding Pain Intervention(s): Limited activity within patient's tolerance    Home Living Family/patient expects to be discharged to:: Private residence Living Arrangements: Other relatives(neice) Available Help at Discharge: Family;Available 24 hours/day Type of Home: House Home Access: Stairs to enter Entrance Stairs-Rails: Left Entrance Stairs-Number of Steps: 4 Home Layout: One level Home Equipment: Walker - 4 wheels      Prior Function Level of Independence: Independent with assistive device(s)         Comments: ambulated household distances with rollater. Reports 2 falls recently. Was independent with ADLs, didn't drive.     Hand Dominance        Extremity/Trunk Assessment   Upper Extremity Assessment Upper Extremity Assessment: Generalized weakness;Difficult to assess due to impaired cognition(L UE grip 3/5)    Lower Extremity Assessment Lower Extremity Assessment: Generalized weakness;Difficult to assess due to impaired cognition(B LE grossly 2/5 evident by inability to SLR)       Communication   Communication: No difficulties  Cognition Arousal/Alertness: Lethargic Behavior During Therapy: Flat affect Overall Cognitive Status: Impaired/Different from baseline Area of Impairment: Orientation;Attention;Memory;Following commands  General Comments: confused and disoriented to place, time, situation      General Comments      Exercises Other Exercises Other Exercises: attempted supine ther-ex on B LE including AP, SLRs, and hip abd/add. Pt needs active assist for performance and grimaces with any movement. ONly able to tolerate 5 reps. HR at 105 and O2 at 88% on 2L of O2. Cued for pursed lip breathing.   Assessment/Plan    PT Assessment Patient needs continued PT services  PT Problem List Decreased strength;Decreased activity tolerance;Decreased balance;Decreased  mobility;Decreased cognition;Pain       PT Treatment Interventions DME instruction;Gait training;Therapeutic exercise;Balance training    PT Goals (Current goals can be found in the Care Plan section)  Acute Rehab PT Goals Patient Stated Goal: unable to state PT Goal Formulation: Patient unable to participate in goal setting Time For Goal Achievement: 06/17/19 Potential to Achieve Goals: Fair    Frequency 7X/week   Barriers to discharge        Co-evaluation               AM-PAC PT "6 Clicks" Mobility  Outcome Measure Help needed turning from your back to your side while in a flat bed without using bedrails?: Total Help needed moving from lying on your back to sitting on the side of a flat bed without using bedrails?: Total Help needed moving to and from a bed to a chair (including a wheelchair)?: Total Help needed standing up from a chair using your arms (e.g., wheelchair or bedside chair)?: Total Help needed to walk in hospital room?: Total Help needed climbing 3-5 steps with a railing? : Total 6 Click Score: 6    End of Session Equipment Utilized During Treatment: Oxygen Activity Tolerance: Patient limited by lethargy Patient left: in bed;with bed alarm set;with family/visitor present Nurse Communication: Mobility status PT Visit Diagnosis: Muscle weakness (generalized) (M62.81);History of falling (Z91.81);Difficulty in walking, not elsewhere classified (R26.2);Pain Pain - Right/Left: Left Pain - part of body: Shoulder    Time: [id number] PT Time Calculation (min) (ACUTE ONLY): 17 min   Charges:   PT Evaluation $PT Eval Moderate Complexity: 1 [address], PT, DPT [phone]   Epsie Walthall 06/03/2019, 4:24 PM

## 2019-06-03 NOTE — Progress Notes (Addendum)
[place] at [facility]                                                                                                                                                                                  Patient Demographics   Katrina Jefferson, is a 85 y.o. female, DOB - [birth year], [id number]  Admit date - 06/01/2019   Admitting Physician Christel Mormon, MD  Outpatient Primary MD for the patient is Glendon Axe, MD   LOS - 1  Subjective: Patient is very uncomfortable She is very nauseous Patient dry heaving   Review of Systems:   CONSTITUTIONAL: No documented fever. No fatigue, weakness. No weight gain, no weight loss.  EYES: No blurry or double vision.  ENT: No tinnitus. No postnasal drip. No redness of the oropharynx.  RESPIRATORY: No cough, no wheeze, no hemoptysis. No dyspnea.  CARDIOVASCULAR: No chest pain. No orthopnea. No palpitations. No syncope.  GASTROINTESTINAL: Positive nausea, no vomiting or diarrhea. No abdominal pain. No melena or hematochezia.  GENITOURINARY: No dysuria or hematuria.  ENDOCRINE: No polyuria or nocturia. No heat or cold intolerance.  HEMATOLOGY: No anemia. No bruising. No bleeding.  INTEGUMENTARY: No rashes. No lesions.  MUSCULOSKELETAL: Positive pain different areas of her body NEUROLOGIC: No numbness, tingling, or ataxia. No seizure-type activity.  PSYCHIATRIC: No anxiety. No insomnia. No ADD.    Vitals:   Vitals:   06/03/19 0649 06/03/19 0813 06/03/19 0847 06/03/19 0922  BP:  (!) 178/80    Pulse: (!) 107 (!) 110    Resp:  18    Temp:  97.9 F (36.6 C)    TempSrc:  Oral    SpO2:  94% (!) 84% 92%  Weight:      Height:        Wt Readings from Last 3 Encounters:  06/01/19 62.6 kg  02/12/17 60.7 kg  01/22/17 60.8 kg     Intake/Output Summary (Last 24 hours) at 06/03/2019 1522 Last data filed at 06/03/2019 1133 Gross per 24 hour  Intake 3 ml  Output 900 ml  Net -897 ml    Physical Exam:    GENERAL: Elderly-appearing female in distress HEAD, EYES, EARS, NOSE AND THROAT: Atraumatic, normocephalic. Extraocular muscles are intact. Pupils equal and reactive to light. Sclerae anicteric. No conjunctival injection. No oro-pharyngeal erythema.  NECK: Supple. There is no jugular venous distention. No bruits, no lymphadenopathy, no thyromegaly.  HEART: Regular rate and rhythm,. No murmurs, no rubs, no clicks.  LUNGS: Clear to auscultation bilaterally. No rales or rhonchi. No wheezes.  ABDOMEN: Soft, flat, nontender, nondistended. Has good bowel sounds. No hepatosplenomegaly appreciated.  EXTREMITIES: No evidence of any cyanosis, clubbing,  or peripheral edema.  +2 pedal and radial pulses bilaterally.  NEUROLOGIC: The patient is alert, awake, and oriented x3 with no focal motor or sensory deficits appreciated bilaterally.  SKIN: Moist and warm with no rashes appreciated.  Psych: Not anxious, depressed LN: No inguinal LN enlargement    Antibiotics   Anti-infectives (From admission, onward)   None      Medications   Scheduled Meds: . amLODipine  2.5 mg Oral Daily  . enoxaparin (LOVENOX) injection  40 mg Subcutaneous Q24H  . escitalopram  20 mg Oral Daily  . insulin aspart  0-5 Units Subcutaneous QHS  . insulin aspart  0-9 Units Subcutaneous TID WC  . loratadine  10 mg Oral Daily  . metFORMIN  1,000 mg Oral BID WC  . pantoprazole  40 mg Oral Daily  . simvastatin  40 mg Oral QHS  . sodium chloride flush  3 mL Intravenous Q12H  . traZODone  100 mg Oral QHS  . vitamin B-12  1,000 mcg Oral Daily   Continuous Infusions: PRN Meds:.acetaminophen **OR** acetaminophen, albuterol, morphine injection, ondansetron **OR** ondansetron (ZOFRAN) IV, oxyCODONE-acetaminophen, promethazine   Data Review:   Micro Results Recent Results (from the past 240 hour(s))  SARS CORONAVIRUS 2 (TAT 6-12 HRS) Nasal Swab Aptima Multi Swab     Status: None   Collection Time: 06/02/19 12:00 PM    Specimen: Aptima Multi Swab; Nasal Swab  Result Value Ref Range Status   SARS Coronavirus 2 NEGATIVE NEGATIVE Final    Comment: (NOTE) SARS-CoV-2 target nucleic acids are NOT DETECTED. The SARS-CoV-2 RNA is generally detectable in upper and lower respiratory specimens during the acute phase of infection. Negative results do not preclude SARS-CoV-2 infection, do not rule out co-infections with other pathogens, and should not be used as the sole basis for treatment or other patient management decisions. Negative results must be combined with clinical observations, patient history, and epidemiological information. The expected result is Negative. Fact Sheet for Patients: SugarRoll.be Fact Sheet for Healthcare Providers: https://www.woods-mathews.com/ This test is not yet approved or cleared by the [place] FDA and  has been authorized for detection and/or diagnosis of SARS-CoV-2 by FDA under an Emergency Use Authorization (EUA). This EUA will remain  in effect (meaning this test can be used) for the duration of the COVID-19 declaration under Section 56 4(b)(1) of the Act, 21 U.S.C. section 360bbb-3(b)(1), unless the authorization is terminated or revoked sooner. Performed at [facility], [place] [address] [place], [place] [address]     Radiology Reports Dg Chest 1 View  Result Date: 06/01/2019 CLINICAL DATA:  Post fall EXAM: CHEST  1 VIEW COMPARISON:  09/18/2015 FINDINGS: Grossly unchanged borderline enlarged cardiac silhouette and mediastinal contours with atherosclerotic plaque within the thoracic aorta. Retrocardiac opacity compatible with known hiatal hernia. The lungs remain hyperexpanded with flattening of the diaphragms and mild diffuse slightly nodular thickening of the pulmonary interstitium. No discrete focal airspace opacities. No pleural effusion or pneumothorax. No evidence of edema. No definite acute osseous abnormalities.  IMPRESSION: 1. Similar findings of borderline cardiomegaly, lung hyperexpansion and chronic bronchitic change without superimposed acute cardiopulmonary disease. 2. Hiatal hernia. Electronically Signed   By: Sandi Mariscal M.D.   On: 06/01/2019 21:39   Dg Wrist Complete Left  Result Date: 06/01/2019 CLINICAL DATA:  Post fall, now with left wrist pain. EXAM: LEFT WRIST - COMPLETE 3+ VIEW COMPARISON:  None. FINDINGS: Osteopenia. Mild diffuse soft tissue swelling about the wrist without associated displaced fracture or  dislocation. Mild-to-moderate degenerative change involving the STT joints of the base of thumb with joint space loss, subchondral sclerosis osteophytosis. Remaining joint spaces appear preserved. No erosions. Suspected minimal amount of chondrocalcinosis within the TFCC. IMPRESSION: Mild diffuse soft tissue swelling about the wrist without associated fracture or dislocation. If the patient has pain referable to the anatomic snuff box, splinting and a follow-up radiographs in 10 to 14 days is recommended to evaluate for occult scaphoid fracture. Electronically Signed   By: Sandi Mariscal M.D.   On: 06/01/2019 21:37   Ct Head Wo Contrast  Result Date: 06/01/2019 CLINICAL DATA:  Fall, headache EXAM: CT HEAD WITHOUT CONTRAST CT CERVICAL SPINE WITHOUT CONTRAST TECHNIQUE: Multidetector CT imaging of the head and cervical spine was performed following the standard protocol without intravenous contrast. Multiplanar CT image reconstructions of the cervical spine were also generated. COMPARISON:  11/28/2015 FINDINGS: CT HEAD FINDINGS Brain: There is atrophy and chronic small vessel disease changes. No acute intracranial abnormality. Specifically, no hemorrhage, hydrocephalus, mass lesion, acute infarction, or significant intracranial injury. Vascular: No hyperdense vessel or unexpected calcification. Skull: No acute calvarial abnormality. Sinuses/Orbits: Visualized paranasal sinuses and mastoids clear. Orbital  soft tissues unremarkable. Other: None CT CERVICAL SPINE FINDINGS Alignment: 2 mm degenerative anterolisthesis of C4 on C5. Skull base and vertebrae: No acute fracture. No primary bone lesion or focal pathologic process. Soft tissues and spinal canal: No prevertebral fluid or swelling. No visible canal hematoma. Disc levels:  Diffuse degenerative disc and facet disease. Upper chest: No acute findings Other: Nodule in the right thyroid lobe measures 2 cm. IMPRESSION: Atrophy, chronic microvascular disease. No acute intracranial abnormality. Diffuse degenerative disc and facet disease. No acute bony abnormality. Electronically Signed   By: Rolm Baptise M.D.   On: 06/01/2019 21:23   Ct Cervical Spine Wo Contrast  Result Date: 06/01/2019 CLINICAL DATA:  Fall, headache EXAM: CT HEAD WITHOUT CONTRAST CT CERVICAL SPINE WITHOUT CONTRAST TECHNIQUE: Multidetector CT imaging of the head and cervical spine was performed following the standard protocol without intravenous contrast. Multiplanar CT image reconstructions of the cervical spine were also generated. COMPARISON:  11/28/2015 FINDINGS: CT HEAD FINDINGS Brain: There is atrophy and chronic small vessel disease changes. No acute intracranial abnormality. Specifically, no hemorrhage, hydrocephalus, mass lesion, acute infarction, or significant intracranial injury. Vascular: No hyperdense vessel or unexpected calcification. Skull: No acute calvarial abnormality. Sinuses/Orbits: Visualized paranasal sinuses and mastoids clear. Orbital soft tissues unremarkable. Other: None CT CERVICAL SPINE FINDINGS Alignment: 2 mm degenerative anterolisthesis of C4 on C5. Skull base and vertebrae: No acute fracture. No primary bone lesion or focal pathologic process. Soft tissues and spinal canal: No prevertebral fluid or swelling. No visible canal hematoma. Disc levels:  Diffuse degenerative disc and facet disease. Upper chest: No acute findings Other: Nodule in the right thyroid lobe  measures 2 cm. IMPRESSION: Atrophy, chronic microvascular disease. No acute intracranial abnormality. Diffuse degenerative disc and facet disease. No acute bony abnormality. Electronically Signed   By: Rolm Baptise M.D.   On: 06/01/2019 21:23   Dg Chest Port 1 View  Result Date: 06/03/2019 CLINICAL DATA:  New onset cough. EXAM: PORTABLE CHEST 1 VIEW COMPARISON:  Single-view of the chest 06/01/2019. PA and lateral chest 09/18/2015. FINDINGS: Lungs are clear. Heart size is upper normal. Aortic atherosclerosis is noted. No pneumothorax or pleural fluid. Small hiatal hernia is seen. IMPRESSION: No acute disease. Atherosclerosis. Small hiatal hernia. Electronically Signed   By: Inge Rise M.D.   On: 06/03/2019 09:26  Dg Shoulder Left  Result Date: 06/01/2019 CLINICAL DATA:  Post fall, now with left shoulder pain. EXAM: LEFT SHOULDER - 2+ VIEW COMPARISON:  None. FINDINGS: There is an acute vertically oriented minimally displaced fracture involving the greater tuberosity of the humeral head. No definitive intra-articular extension. Expected adjacent soft tissue swelling. No radiopaque foreign body. No additional fractures identified. Glenohumeral acromioclavicular joint spaces appear preserved. Linear calcifications regional to the rotator cuff insertion site as could be seen in the setting of calcific tendinitis. Limited visualization of the adjacent thorax is normal. IMPRESSION: Acute, vertically oriented, minimally displaced fracture of the greater tuberosity of the humeral head. Electronically Signed   By: Sandi Mariscal M.D.   On: 06/01/2019 21:34   Dg Hip Unilat With Pelvis 2-3 Views Left  Result Date: 06/01/2019 CLINICAL DATA:  Post fall, now with left hip pain. EXAM: DG HIP (WITH OR WITHOUT PELVIS) 2-3V LEFT COMPARISON:  None. FINDINGS: Acute, displaced fractures involving the left superior and inferior pubic rami without definitive extension to involve the pubic symphysis. No additional fractures  identified. Mild degenerative change of the left hip with joint space loss, subchondral sclerosis and osteophytosis. No evidence of avascular necrosis. Similar mild degenerative change of the contralateral right hip is suspected though incompletely evaluated. Expected adjacent soft tissue swelling.  No radiopaque foreign body. IMPRESSION: Acute, displaced fractures involving the left superior and inferior pubic rami without definitive extension to involve the pubic symphysis. Electronically Signed   By: Sandi Mariscal M.D.   On: 06/01/2019 21:36     CBC Recent Labs  Lab 06/01/19 2147 06/02/19 0432  WBC 11.5* 9.1  HGB 10.3* 9.2*  HCT 33.9* 31.3*  PLT 322 294  MCV 80.5 81.5  MCH 24.5* 24.0*  MCHC 30.4 29.4*  RDW 17.8* 17.8*  LYMPHSABS 1.4  --   MONOABS 0.9  --   EOSABS 0.1  --   BASOSABS 0.1  --     Chemistries  Recent Labs  Lab 06/01/19 2147 06/02/19 0432  NA 138 140  K 4.4 4.4  CL 105 106  CO2 23 25  GLUCOSE 192* 196*  BUN 17 18  CREATININE 0.67 0.56  CALCIUM 8.8* 8.6*   ------------------------------------------------------------------------------------------------------------------ estimated creatinine clearance is 39 mL/min (by C-G formula based on SCr of 0.56 mg/dL). ------------------------------------------------------------------------------------------------------------------ No results for input(s): HGBA1C in the last 72 hours. ------------------------------------------------------------------------------------------------------------------ No results for input(s): CHOL, HDL, LDLCALC, TRIG, CHOLHDL, LDLDIRECT in the last 72 hours. ------------------------------------------------------------------------------------------------------------------ No results for input(s): TSH, T4TOTAL, T3FREE, THYROIDAB in the last 72 hours.  Invalid input(s): FREET3 ------------------------------------------------------------------------------------------------------------------ No  results for input(s): VITAMINB12, FOLATE, FERRITIN, TIBC, IRON, RETICCTPCT in the last 72 hours.  Coagulation profile Recent Labs  Lab 06/01/19 2147  INR 1.0    No results for input(s): DDIMER in the last 72 hours.  Cardiac Enzymes No results for input(s): CKMB, TROPONINI, MYOGLOBIN in the last 168 hours.  Invalid input(s): CK ------------------------------------------------------------------------------------------------------------------ Invalid input(s): Bradley  1.  Left humeral head fracture - Left shoulder sling in place -  Seen by Dr. Dagoberto Reef no plan for surgery - Pain is being managed with analgesic  2.  Left superior/inferior rami fracture -  Weightbearing as tolerated per orthopedic - Pain is being managed with analgesic - PT/OT consulted for supportive care during hospitalization - Social service consulted for likely rehab needs at the time of discharge  3.  Diabetes mellitus -  Sliding scale insulin --Continue metformin   4.  Hypertension -  Norvasc continue -use prn iv hydralzine  DVT and PPI prophylaxis I discussed with the patient and niece if she does not improve patient states that she wants to be comfort care. She also wants to go home as soon as possible does not want to go to rehab he states that they can take care of it     Code Status Orders  (From admission, onward)         Start     Ordered   06/02/19 0918  Do not attempt resuscitation (DNR)  Continuous    Question Answer Comment  In the event of cardiac or respiratory ARREST Do not call a "code blue"   In the event of cardiac or respiratory ARREST Do not perform Intubation, CPR, defibrillation or ACLS   In the event of cardiac or respiratory ARREST Use medication by any route, position, wound care, and other measures to relive pain and suffering. May use oxygen, suction and manual treatment of airway obstruction as needed for comfort.      06/02/19  0917        Code Status History    Date Active Date Inactive Code Status Order ID Comments User Context   06/02/2019 0016 06/02/2019 0917 Full Code [id number]  Mayer Camel, NP ED   11/28/2015 0801 11/30/2015 2005 DNR [id number]  Lance Coon, MD Inpatient   Advance Care Planning Activity           Consults orthopedic  DVT Prophylaxis  Lovenox  Lab Results  Component Value Date   PLT 294 06/02/2019     Time Spent in minutes 35-minute  Greater than 50% of time spent in care coordination and counseling patient regarding the condition and plan of care.   Dustin Flock M.D on 06/03/2019 at 3:22 PM  Between 7am to 6pm - Pager - [phone]  After 6pm go to [email or web address] - Proofreader  [employer]   Office  [phone]

## 2019-06-03 NOTE — TOC Progression Note (Signed)
Transition of Care [facility]) - Progression Note    Patient Details  Name: Katrina Jefferson MRN: [id number] Date of Birth: Nov 26, 1923  Transition of Care [facility]) CM/SW [place], RN Phone Number: 06/03/2019, 10:42 AM  Clinical Narrative:     Spoke with the patient's niece [name], reviewed the bed offers, She chose to go with [facility], I notified Kelly at [facility] of the bed choice and to get Insurance auth       Expected Discharge Plan and Services                                                 Social Determinants of Health (SDOH) Interventions    Readmission Risk Interventions No flowsheet data found.

## 2019-06-04 LAB — GLUCOSE, CAPILLARY
Glucose-Capillary: 277 mg/dL — ABNORMAL HIGH (ref 70–99)
Glucose-Capillary: 83 mg/dL (ref 70–99)

## 2019-06-04 MED ORDER — METOPROLOL TARTRATE 25 MG PO TABS
25.0000 mg | ORAL_TABLET | Freq: Three times a day (TID) | ORAL | Status: DC
  Administered 2019-06-04 – 2019-06-14 (×27): 25 mg via ORAL
  Filled 2019-06-04 (×28): qty 1

## 2019-06-04 MED ORDER — METOPROLOL TARTRATE 5 MG/5ML IV SOLN
5.0000 mg | Freq: Once | INTRAVENOUS | Status: AC
  Administered 2019-06-04: 09:00:00 5 mg via INTRAVENOUS
  Filled 2019-06-04: qty 5

## 2019-06-04 MED ORDER — FUROSEMIDE 40 MG PO TABS
40.0000 mg | ORAL_TABLET | Freq: Once | ORAL | Status: AC
  Administered 2019-06-04: 40 mg via ORAL
  Filled 2019-06-04: qty 1

## 2019-06-04 MED ORDER — GLYCOPYRROLATE 0.2 MG/ML IJ SOLN
0.1000 mg | INTRAMUSCULAR | Status: DC | PRN
  Administered 2019-06-05 – 2019-06-12 (×3): 0.1 mg via INTRAVENOUS
  Filled 2019-06-04 (×5): qty 0.5

## 2019-06-04 MED ORDER — MORPHINE SULFATE (CONCENTRATE) 10 MG/0.5ML PO SOLN
10.0000 mg | ORAL | Status: DC | PRN
  Administered 2019-06-06 – 2019-06-07 (×2): 10 mg via ORAL
  Filled 2019-06-04: qty 1
  Filled 2019-06-04 (×2): qty 0.5
  Filled 2019-06-04: qty 1
  Filled 2019-06-04: qty 0.5
  Filled 2019-06-04: qty 1

## 2019-06-04 NOTE — Progress Notes (Signed)
[place] at [place] NAME: Katrina Jefferson    MR#:  [id number]  DATE OF BIRTH:  Apr 06, 1924  SUBJECTIVE:  CHIEF COMPLAINT:   Chief Complaint  Patient presents with  . Fall  . Shoulder Pain   -Patient appears to be in respiratory distress, heart rate is elevated. -Niece who is her healthcare power of attorney is at bedside. -Patient mentions that she is tired and just wants to sleep and is ready to go.  REVIEW OF SYSTEMS:  Review of Systems  Constitutional: Positive for malaise/fatigue. Negative for chills and fever.  HENT: Positive for hearing loss. Negative for congestion, ear discharge and nosebleeds.   Eyes: Negative for blurred vision and double vision.  Respiratory: Positive for cough, shortness of breath and wheezing.   Cardiovascular: Negative for chest pain, palpitations and leg swelling.  Gastrointestinal: Negative for abdominal pain, constipation, diarrhea, nausea and vomiting.  Genitourinary: Negative for dysuria.  Musculoskeletal: Positive for myalgias.  Neurological: Negative for dizziness, seizures and headaches.    DRUG ALLERGIES:   Allergies  Allergen Reactions  . Codeine Sulfate Other (See Comments)    Reaction:  Unknown   . Erythromycin Other (See Comments)    Reaction:  Unknown   . Sulfa Antibiotics Other (See Comments)    Reaction:  Unknown   . Ultracet [Tramadol-Acetaminophen] Other (See Comments)    Reaction:  Unknown     VITALS:  Blood pressure (!) 156/66, pulse 98, temperature 97.7 F (36.5 C), temperature source Oral, resp. rate (!) 28, height 5\' 2"  (1.575 m), weight 62.6 kg, SpO2 98 %.  PHYSICAL EXAMINATION:  Physical Exam   GENERAL:  85 y.o.-year-old elderly patient lying in the bed, ill appearing EYES: Pupils equal, round, reactive to light and accommodation. No scleral icterus. Extraocular muscles intact.  HEENT: Head atraumatic, normocephalic. Oropharynx and nasopharynx clear.  NECK:   Supple, no jugular venous distention. No thyroid enlargement, no tenderness.  LUNGS: coarse breath sounds bilaterally, scattered wheezing all over the lung fields.  -  Using accessory muscles to breathe CARDIOVASCULAR: S1, S2 normal. No  rubs, or gallops. 2/6 systolic murmur present ABDOMEN: Soft, nontender, nondistended. Bowel sounds present. No organomegaly or mass.  EXTREMITIES: No pedal edema, cyanosis, or clubbing.  Left arm in a sling NEUROLOGIC: Cranial nerves II through XII are intact. Following commands. Sensation intact. Gait not checked. Global weakness  PSYCHIATRIC: The patient is alert and oriented x 3.  SKIN: No obvious rash, lesion, or ulcer.    LABORATORY PANEL:   CBC Recent Labs  Lab 06/02/19 0432  WBC 9.1  HGB 9.2*  HCT 31.3*  PLT 294   ------------------------------------------------------------------------------------------------------------------  Chemistries  Recent Labs  Lab 06/02/19 0432  NA 140  K 4.4  CL 106  CO2 25  GLUCOSE 196*  BUN 18  CREATININE 0.56  CALCIUM 8.6*   ------------------------------------------------------------------------------------------------------------------  Cardiac Enzymes No results for input(s): TROPONINI in the last 168 hours. ------------------------------------------------------------------------------------------------------------------  RADIOLOGY:  Dg Chest Port 1 View  Result Date: 06/03/2019 CLINICAL DATA:  New onset cough. EXAM: PORTABLE CHEST 1 VIEW COMPARISON:  Single-view of the chest 06/01/2019. PA and lateral chest 09/18/2015. FINDINGS: Lungs are clear. Heart size is upper normal. Aortic atherosclerosis is noted. No pneumothorax or pleural fluid. Small hiatal hernia is seen. IMPRESSION: No acute disease. Atherosclerosis. Small hiatal hernia. Electronically Signed   By: Inge Rise M.D.   On: 06/03/2019 09:26    EKG:   Orders placed or performed during  the hospital encounter of 06/01/19  . ED  EKG  . EKG 12-Lead  . EKG 12-Lead  . ED EKG  . EKG 12-Lead  . EKG 12-Lead  . EKG 12-Lead  . EKG 12-Lead  . EKG 12-Lead  . EKG 12-Lead    ASSESSMENT AND PLAN:   85 year old female with past medical history significant for diabetes, osteoporosis, hyperlipidemia, retinal hemorrhage, COPD not on home oxygen presents to hospital secondary to a fall and noted to have left humeral fracture.  1. Acute hypoxic respiratory failure-patient on acutely 2 L oxygen.  Also appears to be dyspneic and tachypneic -Chest x-ray yesterday was clear.  Will give 1 dose of IV Lasix. -Increased secretions.  Patient indicates that she is tired.  Discussed about moving to ICU further versus comfort measures.  Patient and her niece who is healthcare power of attorney agreed that they want to do comfort care at this time. -Discontinue chest x-ray ordered. -Morphine PRN has been ordered for dyspnea and pain  2.  Sinus tachycardia-worsened secondary to respiratory status. -Received IV metoprolol push once this morning.  On oral metoprolol  3.  Depression-on Lexapro  4.  Diabetes-discontinue fingerstick checks.  Patient is comfort care at this time  5.  Fall and left greater tuberosity fracture of the left proximal humerus and left-sided superior and inferior pubic rami fractures-nonsurgical.  Appreciate orthopedics input. -Pain control  Patient is comfort care at this time.  We will focus on her comfort   All the records are reviewed and case discussed with Care Management/Social Workerr. Management plans discussed with the patient, family and they are in agreement.  CODE STATUS: DNR  TOTAL TIME TAKING CARE OF THIS PATIENT: 39 minutes.   POSSIBLE D/C IN ? DAYS, DEPENDING ON CLINICAL CONDITION.   Gladstone Lighter M.D on 06/04/2019 at 1:04 PM  Between 7am to 6pm - Pager - [phone]  After 6pm go to [email or web address] - password [email or web address] [facility]  Office  [phone]  CC:  Primary care physician; Glendon Axe, MD

## 2019-06-04 NOTE — Progress Notes (Signed)
Physical Therapy Treatment Patient Details Name: Katrina Jefferson MRN: [id number] DOB: Apr 25, 1924 Today's Date: 06/04/2019    History of Present Illness Pt admitted for B pubic rami fractures along with L shoulder greater tuberosity fx secondary to fall at home. History includes COPD, depression, DM, and hyperlipidemia.     PT Comments    Pt is making gradual progress towards goals with increased functional mobility this date. Pt more alert and A&O compared to previous session. Able to tolerate there-ex with minimal pain and sit at EOB for brief period. Fatigues quickly and vitals remain concerning, RN notified. Further OOB mobility deferred this session. Will continue to progress as able.   Follow Up Recommendations  SNF     Equipment Recommendations  (TBD)    Recommendations for Other Services       Precautions / Restrictions Precautions Precautions: Fall Restrictions Weight Bearing Restrictions: Yes LUE Weight Bearing: Non weight bearing Other Position/Activity Restrictions: may use L UE on RW, but avoid lift, forward flexion, abduction. WBAT on B LE    Mobility  Bed Mobility Overal bed mobility: Needs Assistance Bed Mobility: Supine to Sit     Supine to sit: Mod assist     General bed mobility comments: needs assist for sliding B LEs off bed and trunk support. Once seated at EOB, pt able to progress to supervision for static sitting balance. Fatigues quickly, only able to tolerate sitting x 2-3 minutes prior to requesting to return supine  Transfers                 General transfer comment: unable secondary to fatigue/vitals  Ambulation/Gait                 Stairs             Wheelchair Mobility    Modified Rankin (Stroke Patients Only)       Balance Overall balance assessment: Needs assistance;History of Falls Sitting-balance support: Feet supported;Bilateral upper extremity supported Sitting balance-Leahy Scale: Fair                                       Cognition Arousal/Alertness: Awake/alert Behavior During Therapy: WFL for tasks assessed/performed Overall Cognitive Status: Within Functional Limits for tasks assessed                                 General Comments: improved cognition this date      Exercises Other Exercises Other Exercises: supine ther-ex performed on B LE including AP, quad sets, SLRs, and hip abd/add. All ther-ex performed x 10 reps with min assist and cues for sequencing. HR elevates to 108bpm with exertion and O2 at 88% while on 2L of O2. RN notified    General Comments        Pertinent Vitals/Pain Pain Assessment: Faces Faces Pain Scale: Hurts a little bit Pain Location: B LE with movement Pain Descriptors / Indicators: Discomfort;Dull;Grimacing;Guarding Pain Intervention(s): Limited activity within patient's tolerance;Repositioned;Patient requesting pain meds-RN notified    Home Living                      Prior Function            PT Goals (current goals can now be found in the care plan section) Acute Rehab PT Goals Patient Stated Goal: to get  stronger PT Goal Formulation: With patient Time For Goal Achievement: 06/17/19 Potential to Achieve Goals: Fair Progress towards PT goals: Progressing toward goals    Frequency    7X/week      PT Plan Current plan remains appropriate    Co-evaluation              AM-PAC PT "6 Clicks" Mobility   Outcome Measure  Help needed turning from your back to your side while in a flat bed without using bedrails?: A Lot Help needed moving from lying on your back to sitting on the side of a flat bed without using bedrails?: A Lot Help needed moving to and from a bed to a chair (including a wheelchair)?: Total Help needed standing up from a chair using your arms (e.g., wheelchair or bedside chair)?: Total Help needed to walk in hospital room?: Total Help needed climbing 3-5 steps  with a railing? : Total 6 Click Score: 8    End of Session Equipment Utilized During Treatment: Oxygen Activity Tolerance: Patient tolerated treatment well Patient left: in bed;with bed alarm set Nurse Communication: Mobility status PT Visit Diagnosis: Muscle weakness (generalized) (M62.81);History of falling (Z91.81);Difficulty in walking, not elsewhere classified (R26.2);Pain Pain - Right/Left: Left Pain - part of body: Shoulder     Time: [id number] PT Time Calculation (min) (ACUTE ONLY): 23 min  Charges:  $Therapeutic Exercise: 23-37 mins                     Greggory Stallion, PT, DPT [phone]    Breion Novacek 06/04/2019, 10:16 AM

## 2019-06-05 NOTE — Progress Notes (Signed)
[facility] at [place] NAME: Katrina Jefferson    MR#:  [id number]  DATE OF BIRTH:  11-09-1923  SUBJECTIVE:  CHIEF COMPLAINT:   Chief Complaint  Patient presents with  . Fall  . Shoulder Pain   -Patient seems to be resting better, still arousable and answering simple questions.  Raspy voice and audible wheezing heard.  Occasional periods of tachypnea noted  REVIEW OF SYSTEMS:  Review of Systems  Constitutional: Positive for malaise/fatigue. Negative for chills and fever.  HENT: Positive for hearing loss. Negative for congestion, ear discharge and nosebleeds.   Eyes: Negative for blurred vision and double vision.  Respiratory: Positive for shortness of breath and wheezing. Negative for cough.   Cardiovascular: Negative for chest pain, palpitations and leg swelling.  Gastrointestinal: Negative for abdominal pain, constipation, diarrhea, nausea and vomiting.  Genitourinary: Negative for dysuria.  Musculoskeletal: Positive for myalgias.  Neurological: Negative for dizziness, seizures and headaches.    DRUG ALLERGIES:   Allergies  Allergen Reactions  . Codeine Sulfate Other (See Comments)    Reaction:  Unknown   . Erythromycin Other (See Comments)    Reaction:  Unknown   . Sulfa Antibiotics Other (See Comments)    Reaction:  Unknown   . Ultracet [Tramadol-Acetaminophen] Other (See Comments)    Reaction:  Unknown     VITALS:  Blood pressure (!) 157/71, pulse (!) 101, temperature 98.4 F (36.9 C), resp. rate 18, height 5\' 2"  (1.575 m), weight 62.6 kg, SpO2 90 %.  PHYSICAL EXAMINATION:  Physical Exam   GENERAL:  85 y.o.-year-old elderly patient lying in the bed, ill appearing EYES: Pupils equal, round, reactive to light and accommodation. No scleral icterus. Extraocular muscles intact.  HEENT: Head atraumatic, normocephalic. Oropharynx and nasopharynx clear.  NECK:  Supple, no jugular venous distention. No thyroid enlargement, no  tenderness.  LUNGS: coarse breath sounds bilaterally, scattered wheezing all over the lung fields.  -  Using accessory muscles to breathe CARDIOVASCULAR: S1, S2 normal. No  rubs, or gallops. 2/6 systolic murmur present ABDOMEN: Soft, nontender, nondistended. Bowel sounds present. No organomegaly or mass.  EXTREMITIES: No pedal edema, cyanosis, or clubbing.  Left arm in a sling NEUROLOGIC: Cranial nerves II through XII are intact. Following commands. Sensation intact. Gait not checked. Global weakness  PSYCHIATRIC: The patient is alert and oriented x 3.  SKIN: No obvious rash, lesion, or ulcer.    LABORATORY PANEL:   CBC Recent Labs  Lab 06/02/19 0432  WBC 9.1  HGB 9.2*  HCT 31.3*  PLT 294   ------------------------------------------------------------------------------------------------------------------  Chemistries  Recent Labs  Lab 06/02/19 0432  NA 140  K 4.4  CL 106  CO2 25  GLUCOSE 196*  BUN 18  CREATININE 0.56  CALCIUM 8.6*   ------------------------------------------------------------------------------------------------------------------  Cardiac Enzymes No results for input(s): TROPONINI in the last 168 hours. ------------------------------------------------------------------------------------------------------------------  RADIOLOGY:  No results found.  EKG:   Orders placed or performed during the hospital encounter of 06/01/19  . ED EKG  . EKG 12-Lead  . EKG 12-Lead  . ED EKG  . EKG 12-Lead  . EKG 12-Lead  . EKG 12-Lead  . EKG 12-Lead  . EKG 12-Lead  . EKG 12-Lead    ASSESSMENT AND PLAN:   85 year old female with past medical history significant for diabetes, osteoporosis, hyperlipidemia, retinal hemorrhage, COPD not on home oxygen presents to hospital secondary to a fall and noted to have left humeral fracture.  1. Acute hypoxic respiratory  failure-patient on acutely 2 L oxygen.   -Significant clinical decline noted.  Chest x-ray was clear.  -She is currently placed on comfort care.  Agreed by patient and her niece at bedside yesterday. -Continue morphine as needed for dyspnea and pain. -Robinul for increased secretions.  2.  Sinus tachycardia-worsened secondary to respiratory status. -Received IV metoprolol push once this morning.  On oral metoprolol  3.  Depression-on Lexapro  4.  Diabetes-discontinue fingerstick checks.  Patient is comfort care at this time  5.  Fall and left greater tuberosity fracture of the left proximal humerus and left-sided superior and inferior pubic rami fractures-nonsurgical.  Appreciate orthopedics input. -Pain control  Patient is comfort care at this time.   Hospice to follow-up tomorrow   All the records are reviewed and case discussed with Care Management/Social Workerr. Management plans discussed with the patient, family and they are in agreement.  CODE STATUS: DNR  TOTAL TIME TAKING CARE OF THIS PATIENT: 26 minutes.   POSSIBLE D/C IN ? DAYS, DEPENDING ON CLINICAL CONDITION.   Gladstone Lighter M.D on 06/05/2019 at 11:58 AM  Between 7am to 6pm - Pager - [phone]  After 6pm go to [email or web address] - password [email or web address] [facility]  Office  [phone]  CC: Primary care physician; Glendon Axe, MD

## 2019-06-05 NOTE — Progress Notes (Signed)
Physical Therapy Discharge Patient Details Name: Katrina Jefferson MRN: [id number] DOB: Aug 01, 1924 Today's Date: 06/05/2019 Time:  -     Patient discharged from PT services secondary to transfer to comfort measures.  Please see latest therapy progress note for current level of functioning and progress toward goals.       Chesley Noon 06/05/2019, 8:15 AM

## 2019-06-06 NOTE — Progress Notes (Signed)
Pt resting in bed with no acute distress noted. Pt denies pain or discomfort.

## 2019-06-06 NOTE — Progress Notes (Signed)
New referral for [place] home received from [facility]. Deliliah and attending physician Dr. Brett Albino notified that there was currently no bed availability. Writer to follow up with family, patient and hospital care team tomorrow. Patient information faxed to referral. Flo Shanks BSN, RN, [place]  [facility]  [phone]

## 2019-06-06 NOTE — TOC Progression Note (Signed)
Transition of Care [facility]) - Progression Note    Patient Details  Name: Katrina Jefferson MRN: [id number] Date of Birth: 09-02-1924  Transition of Care [facility]) CM/SW [place], RN Phone Number: 06/06/2019, 3:23 PM  Clinical Narrative:    Met with the daughter in the room and reviewed the different Hospice facilities The daughter chose to go with [facility] inpatient hospice, I notified Santiago Glad        Expected Discharge Plan and Services                                                 Social Determinants of Health (SDOH) Interventions    Readmission Risk Interventions No flowsheet data found.

## 2019-06-06 NOTE — Care Management Important Message (Signed)
Important Message  Patient Details  Name: LASHAY MCROY MRN: [id number] Date of Birth: Mar 17, 1924   Medicare Important Message Given:  Yes     Juliann Pulse A Tyrisha Benninger 06/06/2019, 3:07 PM

## 2019-06-06 NOTE — Progress Notes (Signed)
   06/06/19 1100  Clinical Encounter Type  Visited With Patient;Health care provider  Visit Type Initial  Referral From Nurse  Consult/Referral To Chaplain  Recommendations Provide support to the patient during stay  Stress Factors  Patient Stress Factors Health changes   Chaplain received a referral from the patient's nurse to visit [id number] care. Upon arrival, the patient was laying in bed with the lights off. She was pleasant and welcoming to this Probation officer. The patient spoke briefly about what brought her to the hospital and how she injured her left arm and leg. The patient reports that she got hurt pretty badly and is unsure of how she lost her balance. During the brief visit the patient began to close her eyes and doze off a bit. This chaplain offered to check back in with her at a later time, which the patient welcomes.

## 2019-06-06 NOTE — Progress Notes (Signed)
[facility] at [place] NAME: Katrina Jefferson    MR#:  [id number]  DATE OF BIRTH:  02-Sep-1924  SUBJECTIVE:   Niece present at bedside.  Feels like patient is doing about the same as yesterday.  She was able to eat a little bit of breakfast today.  Patient has no concerns.  REVIEW OF SYSTEMS:  Review of Systems  Constitutional: Negative for chills, fever and malaise/fatigue.  HENT: Positive for hearing loss. Negative for congestion, ear discharge and nosebleeds.   Eyes: Negative for blurred vision and double vision.  Respiratory: Negative for cough, shortness of breath and wheezing.   Cardiovascular: Negative for chest pain, palpitations and leg swelling.  Gastrointestinal: Negative for abdominal pain, constipation, diarrhea, nausea and vomiting.  Genitourinary: Negative for dysuria.  Musculoskeletal: Negative for myalgias.  Neurological: Negative for dizziness, seizures and headaches.    DRUG ALLERGIES:   Allergies  Allergen Reactions  . Codeine Sulfate Other (See Comments)    Reaction:  Unknown   . Erythromycin Other (See Comments)    Reaction:  Unknown   . Sulfa Antibiotics Other (See Comments)    Reaction:  Unknown   . Ultracet [Tramadol-Acetaminophen] Other (See Comments)    Reaction:  Unknown     VITALS:  Blood pressure (!) 176/82, pulse (!) 101, temperature 97.8 F (36.6 C), resp. rate 16, height 5\' 2"  (1.575 m), weight 62.6 kg, SpO2 93 %.  PHYSICAL EXAMINATION:  Physical Exam   GENERAL:  85 y.o.-year-old elderly patient lying in the bed, ill appearing EYES: Pupils equal, round, reactive to light and accommodation. No scleral icterus. Extraocular muscles intact.  HEENT: Head atraumatic, normocephalic. Oropharynx and nasopharynx clear.  NECK:  Supple, no jugular venous distention. No thyroid enlargement, no tenderness.  LUNGS: + Diffuse rhonchi throughout all lung fields.  Nasal cannula in place.  Patient has mildly  increased work of breathing. CARDIOVASCULAR: RRR, S1, S2 normal. No  rubs, or gallops. 2/6 systolic murmur present ABDOMEN: Soft, nontender, nondistended. Bowel sounds present. No organomegaly or mass.  EXTREMITIES: No pedal edema, cyanosis, or clubbing.  Left arm in a sling NEUROLOGIC: Cranial nerves II through XII are intact. Following commands. Sensation intact. Gait not checked. +Global weakness  PSYCHIATRIC: The patient is alert. Oriented to person. SKIN: No obvious rash, lesion, or ulcer.    LABORATORY PANEL:   CBC Recent Labs  Lab 06/02/19 0432  WBC 9.1  HGB 9.2*  HCT 31.3*  PLT 294   ------------------------------------------------------------------------------------------------------------------  Chemistries  Recent Labs  Lab 06/02/19 0432  NA 140  K 4.4  CL 106  CO2 25  GLUCOSE 196*  BUN 18  CREATININE 0.56  CALCIUM 8.6*   ------------------------------------------------------------------------------------------------------------------  Cardiac Enzymes No results for input(s): TROPONINI in the last 168 hours. ------------------------------------------------------------------------------------------------------------------  RADIOLOGY:  No results found.  EKG:   Orders placed or performed during the hospital encounter of 06/01/19  . ED EKG  . EKG 12-Lead  . EKG 12-Lead  . ED EKG  . EKG 12-Lead  . EKG 12-Lead  . EKG 12-Lead  . EKG 12-Lead  . EKG 12-Lead  . EKG 12-Lead    ASSESSMENT AND PLAN:   85 year old female with past medical history significant for diabetes, osteoporosis, hyperlipidemia, retinal hemorrhage, COPD not on home oxygen presents to hospital secondary to a fall and noted to have left humeral fracture.  Acute hypoxic respiratory failure- requiring 2L O2 by [place].  Chest x-ray negative. -Patient is currently full comfort care,  awaiting residential hospice placement -Continue morphine as needed for dyspnea and pain. -Robinul for  increased secretions.  Left greater tuberosity fracture of the left proximal humerus and left-sided superior and inferior pubic rami fractures -Evaluated by Ortho, who recommended nonsurgical management -Pain control  Sinus tachycardia- improved from yesterday. -Continue metoprolol  Depression- continue Lexapro  Diabetes- no CBGs or insulin due to full comfort care status.  Awaiting placement at residential hospice. No beds available today.  All the records are reviewed and case discussed with Care Management/Social Workerr. Management plans discussed with the patient, family and they are in agreement.  CODE STATUS: DNR  TOTAL TIME TAKING CARE OF THIS PATIENT: 32 minutes.   POSSIBLE D/C unknown, DEPENDING ON CLINICAL CONDITION.   Berna Spare  M.D on 06/06/2019 at 2:32 PM  Between 7am to 6pm - Pager [phone]  After 6pm go to [email or web address] - password [email or web address] [facility]  Office  [phone]  CC: Primary care physician; Glendon Axe, MD

## 2019-06-07 MED ORDER — BISACODYL 10 MG RE SUPP
10.0000 mg | Freq: Every day | RECTAL | Status: DC | PRN
  Administered 2019-06-07: 10 mg via RECTAL
  Filled 2019-06-07: qty 1

## 2019-06-07 NOTE — Progress Notes (Signed)
[facility] at [place] NAME: Katrina Jefferson    MR#:  [id number]  DATE OF BIRTH:  12-19-1923  SUBJECTIVE:   No family at bedside this morning.  Patient states she is thirsty, but has no other concerns.  She denies any shortness of breath.  REVIEW OF SYSTEMS:  Review of Systems  Constitutional: Negative for chills, fever and malaise/fatigue.  HENT: Positive for hearing loss. Negative for congestion, ear discharge and nosebleeds.   Eyes: Negative for blurred vision and double vision.  Respiratory: Negative for cough, shortness of breath and wheezing.   Cardiovascular: Negative for chest pain, palpitations and leg swelling.  Gastrointestinal: Negative for abdominal pain, constipation, diarrhea, nausea and vomiting.  Genitourinary: Negative for dysuria.  Musculoskeletal: Negative for myalgias.  Neurological: Negative for dizziness, seizures and headaches.    DRUG ALLERGIES:   Allergies  Allergen Reactions  . Codeine Sulfate Other (See Comments)    Reaction:  Unknown   . Erythromycin Other (See Comments)    Reaction:  Unknown   . Sulfa Antibiotics Other (See Comments)    Reaction:  Unknown   . Ultracet [Tramadol-Acetaminophen] Other (See Comments)    Reaction:  Unknown     VITALS:  Blood pressure (!) 156/69, pulse 98, temperature 97.6 F (36.4 C), temperature source Axillary, resp. rate 20, height 5\' 2"  (1.575 m), weight 62.6 kg, SpO2 96 %.  PHYSICAL EXAMINATION:  Physical Exam   GENERAL:  85 y.o.-year-old elderly patient lying in the bed, ill appearing EYES: Pupils equal, round, reactive to light and accommodation. No scleral icterus. Extraocular muscles intact.  HEENT: Head atraumatic, normocephalic. Oropharynx and nasopharynx clear.  NECK:  Supple, no jugular venous distention. No thyroid enlargement, no tenderness.  LUNGS: + Diffuse expiratory wheezing present throughout all lung fields.  Nasal cannula in place.  Patient has  mildly increased work of breathing. CARDIOVASCULAR: RRR, S1, S2 normal. No  rubs, or gallops. 2/6 systolic murmur present ABDOMEN: Soft, nontender, nondistended. Bowel sounds present. No organomegaly or mass.  EXTREMITIES: No pedal edema, cyanosis, or clubbing.  Left arm in a sling NEUROLOGIC: Cranial nerves II through XII are intact. Following commands. Sensation intact. Gait not checked. +Global weakness  PSYCHIATRIC: The patient is alert. Oriented to person. SKIN: No obvious rash, lesion, or ulcer.    LABORATORY PANEL:   CBC Recent Labs  Lab 06/02/19 0432  WBC 9.1  HGB 9.2*  HCT 31.3*  PLT 294   ------------------------------------------------------------------------------------------------------------------  Chemistries  Recent Labs  Lab 06/02/19 0432  NA 140  K 4.4  CL 106  CO2 25  GLUCOSE 196*  BUN 18  CREATININE 0.56  CALCIUM 8.6*   ------------------------------------------------------------------------------------------------------------------  Cardiac Enzymes No results for input(s): TROPONINI in the last 168 hours. ------------------------------------------------------------------------------------------------------------------  RADIOLOGY:  No results found.  EKG:   Orders placed or performed during the hospital encounter of 06/01/19  . ED EKG  . EKG 12-Lead  . EKG 12-Lead  . ED EKG  . EKG 12-Lead  . EKG 12-Lead  . EKG 12-Lead  . EKG 12-Lead  . EKG 12-Lead  . EKG 12-Lead    ASSESSMENT AND PLAN:   Acute hypoxic respiratory failure- requiring 2L O2 by [place].  Chest x-ray negative. -Patient is currently full comfort care, awaiting residential hospice placement. No beds available currently. -Continue morphine as needed for dyspnea and pain. -Robinul for increased secretions. -Duonebs prn  Left greater tuberosity fracture of the left proximal humerus and left-sided superior and inferior  pubic rami fractures -Evaluated by Ortho, who recommended  nonsurgical management -Pain control  Sinus tachycardia- improved from yesterday. -Continue metoprolol  Depression- continue Lexapro  Diabetes- no CBGs or insulin due to full comfort care status.  Awaiting placement at residential hospice. No beds available today.  All the records are reviewed and case discussed with Care Management/Social Workerr. Management plans discussed with the patient, family and they are in agreement.  CODE STATUS: DNR  TOTAL TIME TAKING CARE OF THIS PATIENT: 32 minutes.   POSSIBLE D/C unknown, DEPENDING ON CLINICAL CONDITION.   Berna Spare  M.D on 06/07/2019 at 1:59 PM  Between 7am to 6pm - Pager - [phone]  After 6pm go to [email or web address] - password [email or web address] [facility] [facility]  Office  [phone]  CC: Primary care physician; Glendon Axe, MD

## 2019-06-07 NOTE — Progress Notes (Signed)
Visit made to new referral for [facility] home. Patient seen lying in bed alert, niece [name] and friend at bedside. [name] was leaving, friend to replace her to sit with patient. {er report of staff RN Ana, patient continues to eat only bites. Last dose of pain medication given at 2 am. Writer to meet in the morning with [name] to provide education regarding hospice home services. CMRN Belia Heman and attending MD Dr. Brett Albino updated. Flo Shanks BSN, RN, [facility] [place] hosp[facility] [phone]

## 2019-06-08 MED ORDER — LORAZEPAM 2 MG/ML IJ SOLN: 0.5000 mg | INTRAMUSCULAR | Status: DC | PRN

## 2019-06-08 MED ORDER — MORPHINE SULFATE (PF) 2 MG/ML IV SOLN
2.0000 mg | Freq: Once | INTRAVENOUS | Status: AC
  Administered 2019-06-08: 15:00:00 2 mg via INTRAVENOUS
  Filled 2019-06-08: qty 1

## 2019-06-08 MED ORDER — LORAZEPAM 0.5 MG PO TABS: 0.5000 mg | ORAL_TABLET | ORAL | Status: DC | PRN

## 2019-06-08 NOTE — Progress Notes (Signed)
Order received for 2 mg Morphine IV once now

## 2019-06-08 NOTE — Care Management Important Message (Signed)
Important Message  Patient Details  Name: Katrina Jefferson MRN: [id number] Date of Birth: 08-03-1924   Medicare Important Message Given:  Yes     Juliann Pulse A Damarian Priola 06/08/2019, 11:35 AM

## 2019-06-08 NOTE — Progress Notes (Signed)
[place] at [place] NAME: Katrina Jefferson    MR#:  [id number]  DATE OF BIRTH:  13-Mar-1924  SUBJECTIVE:   Still eating only a couple of bites of food each day.  Taking very little by mouth.  Breathing seems to have improved.  Patient without any concerns this morning.  She is alert.  REVIEW OF SYSTEMS:  Review of Systems  Constitutional: Negative for chills, fever and malaise/fatigue.  HENT: Positive for hearing loss. Negative for congestion, ear discharge and nosebleeds.   Eyes: Negative for blurred vision and double vision.  Respiratory: Negative for cough, shortness of breath and wheezing.   Cardiovascular: Negative for chest pain, palpitations and leg swelling.  Gastrointestinal: Negative for abdominal pain, constipation, diarrhea, nausea and vomiting.  Genitourinary: Negative for dysuria.  Musculoskeletal: Negative for myalgias.  Neurological: Negative for dizziness, seizures and headaches.    DRUG ALLERGIES:   Allergies  Allergen Reactions  . Codeine Sulfate Other (See Comments)    Reaction:  Unknown   . Erythromycin Other (See Comments)    Reaction:  Unknown   . Sulfa Antibiotics Other (See Comments)    Reaction:  Unknown   . Ultracet [Tramadol-Acetaminophen] Other (See Comments)    Reaction:  Unknown     VITALS:  Blood pressure (!) 175/73, pulse 89, temperature 98.8 F (37.1 C), temperature source Oral, resp. rate 18, height 5\' 2"  (1.575 m), weight 62.6 kg, SpO2 95 %.  PHYSICAL EXAMINATION:  Physical Exam   GENERAL:  85 y.o.-year-old elderly patient lying in the bed, ill appearing EYES: Pupils equal, round, reactive to light and accommodation. No scleral icterus. Extraocular muscles intact.  HEENT: Head atraumatic, normocephalic. Oropharynx and nasopharynx clear.  NECK:  Supple, no jugular venous distention. No thyroid enlargement, no tenderness.  LUNGS: + Faint expiratory wheezing present throughout all lung  fields.  Nasal cannula in place.  Normal work of breathing CARDIOVASCULAR: RRR, S1, S2 normal. No  rubs, or gallops. 2/6 systolic murmur present ABDOMEN: Soft, nontender, nondistended. Bowel sounds present. No organomegaly or mass.  EXTREMITIES: No pedal edema, cyanosis, or clubbing.  Left arm in a sling NEUROLOGIC: Cranial nerves II through XII are intact. Following commands. Sensation intact. Gait not checked. +Global weakness  PSYCHIATRIC: The patient is alert. Oriented to person. SKIN: No obvious rash, lesion, or ulcer.    LABORATORY PANEL:   CBC Recent Labs  Lab 06/02/19 0432  WBC 9.1  HGB 9.2*  HCT 31.3*  PLT 294   ------------------------------------------------------------------------------------------------------------------  Chemistries  Recent Labs  Lab 06/02/19 0432  NA 140  K 4.4  CL 106  CO2 25  GLUCOSE 196*  BUN 18  CREATININE 0.56  CALCIUM 8.6*   ------------------------------------------------------------------------------------------------------------------  Cardiac Enzymes No results for input(s): TROPONINI in the last 168 hours. ------------------------------------------------------------------------------------------------------------------  RADIOLOGY:  No results found.  EKG:   Orders placed or performed during the hospital encounter of 06/01/19  . ED EKG  . EKG 12-Lead  . EKG 12-Lead  . ED EKG  . EKG 12-Lead  . EKG 12-Lead  . EKG 12-Lead  . EKG 12-Lead  . EKG 12-Lead  . EKG 12-Lead    ASSESSMENT AND PLAN:   Acute hypoxic respiratory failure- requiring 4L O2 by [place].  Chest x-ray negative. -Patient is currently full comfort care, awaiting residential hospice placement. No beds available currently. -Continue morphine as needed for dyspnea and pain. -Add Ativan IV as needed -Robinul for increased secretions. -Duonebs prn  Left greater  tuberosity fracture of the left proximal humerus and left-sided superior and inferior pubic rami  fractures -Evaluated by Ortho, who recommended nonsurgical management -Pain control  Sinus tachycardia- improved from yesterday. -Continue metoprolol  Depression- continue Lexapro  Diabetes- no CBGs or insulin due to full comfort care status.  Awaiting placement at residential hospice. No beds available today.  All the records are reviewed and case discussed with Care Management/Social Workerr. Management plans discussed with the patient, family and they are in agreement.  CODE STATUS: DNR  TOTAL TIME TAKING CARE OF THIS PATIENT: 32 minutes.   POSSIBLE D/C unknown, DEPENDING ON CLINICAL CONDITION.   Berna Spare Lalita Ebel M.D on 06/08/2019 at 1:02 PM  Between 7am to 6pm - Pager - [phone]  After 6pm go to [email or web address] - password [email or web address] [facility]  Office  [phone]  CC: Primary care physician; Glendon Axe, MD

## 2019-06-08 NOTE — Progress Notes (Signed)
Visit made to new referral for [facility] home. Patient seen lying in bed, moaning and restless, increased respiratory rate and effort. Niece [name] at bedside. Staff RN Tiffany notified and in to assess patient. Symptom management effective with IV morphine. Writer then spoke with [name] to provide information regarding hospice services, philosophy and team approach to care with understanding voiced. Writer also made [place] aware that at this time due to [place] [facility] hospice is unable to offer in person volunteer visits and that virtual visits are available on request. CMRN Deliliah Belenda Cruise also made aware. [name] and hospital care team all aware that there is currently no bed available for transfer today. Will continue to follow. Flo Shanks BSN, RN, [facility] [place] [phone]

## 2019-06-09 MED ORDER — MORPHINE SULFATE (CONCENTRATE) 10 MG/0.5ML PO SOLN
5.0000 mg | ORAL | Status: DC
  Administered 2019-06-09 – 2019-06-14 (×19): 5 mg via ORAL
  Filled 2019-06-09 (×20): qty 1

## 2019-06-09 NOTE — TOC Progression Note (Signed)
Transition of Care [facility]) - Progression Note    Patient Details  Name: Katrina Jefferson MRN: [id number] Date of Birth: 17-May-1924  Transition of Care [facility]) CM/SW Contact  Su Hilt, RN Phone Number: 06/09/2019, 8:56 AM  Clinical Narrative:     Patient continues to wait on a bed at the Hospice facilty       Expected Discharge Plan and Services                                                 Social Determinants of Health (SDOH) Interventions    Readmission Risk Interventions No flowsheet data found.

## 2019-06-09 NOTE — Progress Notes (Signed)
Patient remains on the waiting list for the hospice home.  No bed available today.  Will continue to follow.  Dimas Aguas, RN Clinical Nurse Liaison [phone] [facility]

## 2019-06-09 NOTE — Progress Notes (Signed)
[place] at [place] NAME: Katrina Jefferson    MR#:  [id number]  DATE OF BIRTH:  Aug 17, 1924  SUBJECTIVE:   Patient having some worsening shortness of breath this morning.  Appears mildly uncomfortable.  She is not answering questions today.  REVIEW OF SYSTEMS:  Unable to obtain due to critical illness  DRUG ALLERGIES:   Allergies  Allergen Reactions  . Codeine Sulfate Other (See Comments)    Reaction:  Unknown   . Erythromycin Other (See Comments)    Reaction:  Unknown   . Sulfa Antibiotics Other (See Comments)    Reaction:  Unknown   . Ultracet [Tramadol-Acetaminophen] Other (See Comments)    Reaction:  Unknown     VITALS:  Blood pressure (!) 197/75, pulse 86, temperature 98 F (36.7 C), resp. rate 17, height 5\' 2"  (1.575 m), weight 62.6 kg, SpO2 97 %.  PHYSICAL EXAMINATION:  Physical Exam   GENERAL:  85 y.o.-year-old elderly patient lying in the bed, ill appearing EYES: Pupils equal, round, reactive to light and accommodation. No scleral icterus. Extraocular muscles intact.  HEENT: Head atraumatic, normocephalic. Oropharynx and nasopharynx clear.  NECK:  Supple, no jugular venous distention. No thyroid enlargement, no tenderness.  LUNGS: + Diffuse expiratory wheezing present.  Nasal cannula in place. + Mildly increased work of breathing CARDIOVASCULAR: RRR, S1, S2 normal. No  rubs, or gallops. 2/6 systolic murmur present ABDOMEN: Soft, nontender, nondistended. Bowel sounds present. No organomegaly or mass.  EXTREMITIES: No pedal edema, cyanosis, or clubbing.  Left arm in a sling NEUROLOGIC: Cranial nerves II through XII are intact. Following commands. Sensation intact. Gait not checked. +Global weakness  PSYCHIATRIC: The patient is alert. Oriented to person. SKIN: No obvious rash, lesion, or ulcer.    LABORATORY PANEL:   CBC No results for input(s): WBC, HGB, HCT, PLT in the last 168 hours.  ------------------------------------------------------------------------------------------------------------------  Chemistries  No results for input(s): NA, K, CL, CO2, GLUCOSE, BUN, CREATININE, CALCIUM, MG, AST, ALT, ALKPHOS, BILITOT in the last 168 hours.  Invalid input(s): GFRCGP ------------------------------------------------------------------------------------------------------------------  Cardiac Enzymes No results for input(s): TROPONINI in the last 168 hours. ------------------------------------------------------------------------------------------------------------------  RADIOLOGY:  No results found.  EKG:   Orders placed or performed during the hospital encounter of 06/01/19  . ED EKG  . EKG 12-Lead  . EKG 12-Lead  . ED EKG  . EKG 12-Lead  . EKG 12-Lead  . EKG 12-Lead  . EKG 12-Lead  . EKG 12-Lead  . EKG 12-Lead    ASSESSMENT AND PLAN:   Acute hypoxic respiratory failure- requiring 4L O2 by [place].  Chest x-ray negative. -Patient is currently full comfort care, awaiting residential hospice placement. No beds available currently. -Will start scheduled Roxanol to help with discomfort -IV morphine as needed breakthrough pain/discomfort -Continue Ativan IV as needed -Robinul for increased secretions. -Duonebs prn  Left greater tuberosity fracture of the left proximal humerus and left-sided superior and inferior pubic rami fractures -Evaluated by Ortho, who recommended nonsurgical management -Pain control  Sinus tachycardia- improved from yesterday. -Continue metoprolol  Depression- continue Lexapro  Diabetes- no CBGs or insulin due to full comfort care status.  Awaiting placement at residential hospice. No beds available today.  All the records are reviewed and case discussed with Care Management/Social Workerr. Management plans discussed with the patient, family and they are in agreement.  CODE STATUS: DNR  TOTAL TIME TAKING CARE OF THIS PATIENT: 32  minutes.   POSSIBLE D/C unknown, DEPENDING ON CLINICAL  CONDITION.   Berna Spare Brytnee Bechler M.D on 06/09/2019 at 11:20 AM  Between 7am to 6pm - Pager - [phone]  After 6pm go to [email or web address] - password [email or web address] [facility]  Office  [phone]  CC: Primary care physician; Glendon Axe, MD

## 2019-06-10 MED ORDER — NYSTATIN 100000 UNIT/ML MT SUSP
5.0000 mL | Freq: Four times a day (QID) | OROMUCOSAL | Status: DC
  Administered 2019-06-10 – 2019-06-14 (×11): 500000 [IU] via ORAL
  Filled 2019-06-10 (×11): qty 5

## 2019-06-10 NOTE — Progress Notes (Signed)
   06/10/19 1200  Clinical Encounter Type  Visited With Patient;Family  Visit Type Follow-up  Referral From Nurse  Consult/Referral To Chaplain  Stress Factors  Patient Stress Factors Not reviewed  Family Stress Factors Lack of knowledge   Chaplain received a referral to visit the patient from her nurse. Upon arrival, the patient's niece was sitting at the bedside. The patient spoke very little to this writer, but acknowledged my presence in soft grunts. The patient answered a couple questions from her niece regarding breakfast. The patient expressed no needs a this time. This Probation officer will return to follow up at a later time today.

## 2019-06-10 NOTE — Progress Notes (Signed)
[place] at [place] NAME: Jaiyah Pinera    MR#:  [id number]  DATE OF BIRTH:  07-24-1924  SUBJECTIVE:   Patient seems to be doing a little bit better today.  She was able to eat a couple bites of breakfast.  She denies any chest pain, shortness of breath, or abdominal pain.  REVIEW OF SYSTEMS:  Unable to obtain dementia  DRUG ALLERGIES:   Allergies  Allergen Reactions  . Codeine Sulfate Other (See Comments)    Reaction:  Unknown   . Erythromycin Other (See Comments)    Reaction:  Unknown   . Sulfa Antibiotics Other (See Comments)    Reaction:  Unknown   . Ultracet [Tramadol-Acetaminophen] Other (See Comments)    Reaction:  Unknown     VITALS:  Blood pressure (!) 176/75, pulse 93, temperature 97.7 F (36.5 C), temperature source Oral, resp. rate 20, height 5\' 2"  (1.575 m), weight 62.6 kg, SpO2 93 %.  PHYSICAL EXAMINATION:  Physical Exam   GENERAL:  85 y.o.-year-old elderly patient lying in the bed, thin appearing. EYES: Pupils equal, round, reactive to light and accommodation. No scleral icterus. Extraocular muscles intact.  HEENT: Head atraumatic, normocephalic. Oropharynx and nasopharynx clear.  NECK:  Supple, no jugular venous distention. No thyroid enlargement, no tenderness.  LUNGS: + Faint expiratory wheezing present.  Nasal cannula in place.  Normal work of breathing. CARDIOVASCULAR: RRR, S1, S2 normal. No  rubs, or gallops. 2/6 systolic murmur present ABDOMEN: Soft, nontender, nondistended. Bowel sounds present. No organomegaly or mass.  EXTREMITIES: No pedal edema, cyanosis, or clubbing.  Left arm in a sling NEUROLOGIC: Cranial nerves II through XII are intact. Following commands. Sensation intact. Gait not checked. +Global weakness  PSYCHIATRIC: The patient is alert. Oriented to person. SKIN: No obvious rash, lesion, or ulcer.    LABORATORY PANEL:   CBC No results for input(s): WBC, HGB, HCT, PLT in the last  168 hours. ------------------------------------------------------------------------------------------------------------------  Chemistries  No results for input(s): NA, K, CL, CO2, GLUCOSE, BUN, CREATININE, CALCIUM, MG, AST, ALT, ALKPHOS, BILITOT in the last 168 hours.  Invalid input(s): GFRCGP ------------------------------------------------------------------------------------------------------------------  Cardiac Enzymes No results for input(s): TROPONINI in the last 168 hours. ------------------------------------------------------------------------------------------------------------------  RADIOLOGY:  No results found.  EKG:   Orders placed or performed during the hospital encounter of 06/01/19  . ED EKG  . EKG 12-Lead  . EKG 12-Lead  . ED EKG  . EKG 12-Lead  . EKG 12-Lead  . EKG 12-Lead  . EKG 12-Lead  . EKG 12-Lead  . EKG 12-Lead    ASSESSMENT AND PLAN:   Acute hypoxic respiratory failure- requiring 4L O2 by [place].  Chest x-ray negative. -Patient is currently full comfort care, awaiting residential hospice placement. No beds available currently. -Continue scheduled Roxanol for shortness of breath or discomfort -IV morphine as needed breakthrough pain/discomfort -Continue Ativan IV as needed -Robinul for increased secretions. -Duonebs prn  Left greater tuberosity fracture of the left proximal humerus and left-sided superior and inferior pubic rami fractures -Evaluated by Ortho, who recommended nonsurgical management -Pain control  Sinus tachycardia- improved from yesterday. -Continue metoprolol  Depression- continue Lexapro  Diabetes- no CBGs or insulin due to full comfort care status.  Awaiting placement at residential hospice. No beds available today.  All the records are reviewed and case discussed with Care Management/Social Workerr. Management plans discussed with the patient, family and they are in agreement.  CODE STATUS: DNR  TOTAL TIME TAKING  CARE  OF THIS PATIENT: 28 minutes.   POSSIBLE D/C unknown, DEPENDING ON CLINICAL CONDITION.   Berna Spare Ily Denno M.D on 06/10/2019 at 1:24 PM  Between 7am to 6pm - Pager - [phone]  After 6pm go to [email or web address] - password [email or web address] [facility]  Office  [phone]  CC: Primary care physician; Glendon Axe, MD

## 2019-06-10 NOTE — Progress Notes (Signed)
Pt complains of sore mouth. MD notified for order for nystatin. I will continue to assess.

## 2019-06-10 NOTE — Care Management Important Message (Signed)
Important Message  Patient Details  Name: Katrina Jefferson MRN: [id number] Date of Birth: 01/08/1924   Medicare Important Message Given:  Yes     Dannette Barbara 06/10/2019, 11:09 AM

## 2019-06-10 NOTE — Progress Notes (Signed)
Patient remains on the hospice home waiting list.  No beds available today.  Will continue to follow. If a bed becomes available over the weekend, the hospice home staff will call the hospital.    Dimas Aguas, [place] [phone]

## 2019-06-11 NOTE — Progress Notes (Signed)
[place] at [place] NAME: Katrina Jefferson    MR#:  [id number]  DATE OF BIRTH:  03/14/1924  SUBJECTIVE:   Patient saying that her mouth hurts and is very dry this morning she is asking for a drink.  She denies any other concerns.  No shortness of breath.  REVIEW OF SYSTEMS:  Unable to obtain dementia  DRUG ALLERGIES:   Allergies  Allergen Reactions  . Codeine Sulfate Other (See Comments)    Reaction:  Unknown   . Erythromycin Other (See Comments)    Reaction:  Unknown   . Sulfa Antibiotics Other (See Comments)    Reaction:  Unknown   . Ultracet [Tramadol-Acetaminophen] Other (See Comments)    Reaction:  Unknown     VITALS:  Blood pressure (!) 156/60, pulse 86, temperature 98.6 F (37 C), temperature source Oral, resp. rate 17, height 5\' 2"  (1.575 m), weight 62.6 kg, SpO2 92 %.  PHYSICAL EXAMINATION:  Physical Exam   GENERAL:  85 y.o.-year-old elderly patient lying in the bed, thin appearing. EYES: Pupils equal, round, reactive to light and accommodation. No scleral icterus. Extraocular muscles intact.  HEENT: Head atraumatic, normocephalic. + Small amount of white material present on the tongue NECK:  Supple, no jugular venous distention. No thyroid enlargement, no tenderness.  LUNGS: + Faint expiratory wheezing present.  Nasal cannula in place.  Normal work of breathing. CARDIOVASCULAR: RRR, S1, S2 normal. No  rubs, or gallops. 2/6 systolic murmur present ABDOMEN: Soft, nontender, nondistended. Bowel sounds present. No organomegaly or mass.  EXTREMITIES: No pedal edema, cyanosis, or clubbing.  Left arm in a sling NEUROLOGIC: Cranial nerves II through XII are intact. Following commands. Sensation intact. Gait not checked. +Global weakness  PSYCHIATRIC: The patient is alert. Oriented to person. SKIN: No obvious rash, lesion, or ulcer.    LABORATORY PANEL:   CBC No results for input(s): WBC, HGB, HCT, PLT in the last 168  hours. ------------------------------------------------------------------------------------------------------------------  Chemistries  No results for input(s): NA, K, CL, CO2, GLUCOSE, BUN, CREATININE, CALCIUM, MG, AST, ALT, ALKPHOS, BILITOT in the last 168 hours.  Invalid input(s): GFRCGP ------------------------------------------------------------------------------------------------------------------  Cardiac Enzymes No results for input(s): TROPONINI in the last 168 hours. ------------------------------------------------------------------------------------------------------------------  RADIOLOGY:  No results found.  EKG:   Orders placed or performed during the hospital encounter of 06/01/19  . ED EKG  . EKG 12-Lead  . EKG 12-Lead  . ED EKG  . EKG 12-Lead  . EKG 12-Lead  . EKG 12-Lead  . EKG 12-Lead  . EKG 12-Lead  . EKG 12-Lead    ASSESSMENT AND PLAN:   Acute hypoxic respiratory failure- requiring 4L O2 by [place].  Chest x-ray negative. -Patient is currently full comfort care, awaiting residential hospice placement. No beds available currently. -Continue scheduled Roxanol for shortness of breath or discomfort -IV morphine as needed breakthrough pain/discomfort -Continue Ativan IV as needed -Robinul for increased secretions. -Duonebs prn  Left greater tuberosity fracture of the left proximal humerus and left-sided superior and inferior pubic rami fractures -Evaluated by Ortho, who recommended nonsurgical management -Pain control  Oral thrush -Nystatin qid  Depression- continue Lexapro  Diabetes- no CBGs or insulin due to full comfort care status.  Awaiting placement at residential hospice. No beds available today.  All the records are reviewed and case discussed with Care Management/Social Workerr. Management plans discussed with the patient, family and they are in agreement.  CODE STATUS: DNR  TOTAL TIME TAKING CARE OF THIS PATIENT:  28 minutes.   POSSIBLE  D/C unknown, DEPENDING ON CLINICAL CONDITION.   Berna Spare Mayo M.D on 06/11/2019 at 1:48 PM  Between 7am to 6pm - Pager - [phone]  After 6pm go to [email or web address] - password [email or web address] [facility]  Office  [phone]  CC: Primary care physician; Glendon Axe, MD

## 2019-06-12 NOTE — Progress Notes (Addendum)
[place] at [place] NAME: Katrina Jefferson    MR#:  [id number]  DATE OF BIRTH:  1924-04-16  SUBJECTIVE:   Patient has no concerns this morning.  She states she does not want to eat or drink anything.  No chest pain or shortness of breath.  REVIEW OF SYSTEMS:  Unable to obtain dementia  DRUG ALLERGIES:   Allergies  Allergen Reactions  . Codeine Sulfate Other (See Comments)    Reaction:  Unknown   . Erythromycin Other (See Comments)    Reaction:  Unknown   . Sulfa Antibiotics Other (See Comments)    Reaction:  Unknown   . Ultracet [Tramadol-Acetaminophen] Other (See Comments)    Reaction:  Unknown     VITALS:  Blood pressure (!) 159/66, pulse 95, temperature 98.2 F (36.8 C), temperature source Oral, resp. rate 19, height 5\' 2"  (1.575 m), weight 62.6 kg, SpO2 90 %.  PHYSICAL EXAMINATION:  Physical Exam   GENERAL:  85 y.o.-year-old elderly patient lying in the bed, thin appearing. EYES: Pupils equal, round, reactive to light and accommodation. No scleral icterus. Extraocular muscles intact.  HEENT: Head atraumatic, normocephalic. + Small amount of white material present on the tongue NECK:  Supple, no jugular venous distention. No thyroid enlargement, no tenderness.  LUNGS: Lungs are clear to auscultation bilaterally.  Nasal cannula in place.  Normal work of breathing. CARDIOVASCULAR: RRR, S1, S2 normal. No  rubs, or gallops. 2/6 systolic murmur present ABDOMEN: Soft, nontender, nondistended. Bowel sounds present. No organomegaly or mass.  EXTREMITIES: No pedal edema, cyanosis, or clubbing.  Left arm in a sling NEUROLOGIC: Cranial nerves II through XII are intact. Following commands. Sensation intact. Gait not checked. +Global weakness  PSYCHIATRIC: The patient is alert. Oriented to person. SKIN: No obvious rash, lesion, or ulcer.    LABORATORY PANEL:   CBC No results for input(s): WBC, HGB, HCT, PLT in the last 168 hours.  ------------------------------------------------------------------------------------------------------------------  Chemistries  No results for input(s): NA, K, CL, CO2, GLUCOSE, BUN, CREATININE, CALCIUM, MG, AST, ALT, ALKPHOS, BILITOT in the last 168 hours.  Invalid input(s): GFRCGP ------------------------------------------------------------------------------------------------------------------  Cardiac Enzymes No results for input(s): TROPONINI in the last 168 hours. ------------------------------------------------------------------------------------------------------------------  RADIOLOGY:  No results found.  EKG:   Orders placed or performed during the hospital encounter of 06/01/19  . ED EKG  . EKG 12-Lead  . EKG 12-Lead  . ED EKG  . EKG 12-Lead  . EKG 12-Lead  . EKG 12-Lead  . EKG 12-Lead  . EKG 12-Lead  . EKG 12-Lead    ASSESSMENT AND PLAN:   Acute hypoxic respiratory failure- requiring 4L O2 by [place].  Chest x-ray negative. -Patient is currently full comfort care, awaiting residential hospice placement. No beds available currently. -Continue scheduled Roxanol for shortness of breath or discomfort -IV morphine as needed breakthrough pain/discomfort- has not required any doses of IV morphine in the last 48 hours -Continue Ativan IV as needed -Robinul for increased secretions. -Duonebs prn  Left greater tuberosity fracture of the left proximal humerus and left-sided superior and inferior pubic rami fractures -Evaluated by Ortho, who recommended nonsurgical management -Pain control  Oral thrush- improved from yesterday -Nystatin qid  Depression- continue Lexapro  Diabetes- no CBGs or insulin due to full comfort care status.  Awaiting placement at residential hospice. No beds available today.  All the records are reviewed and case discussed with Care Management/Social Workerr. Management plans discussed with the patient, family and they are  in agreement.   CODE STATUS: DNR  TOTAL TIME TAKING CARE OF THIS PATIENT: 28 minutes.   POSSIBLE D/C unknown, DEPENDING ON CLINICAL CONDITION.   Berna Spare  M.D on 06/12/2019 at 12:04 PM  Between 7am to 6pm - Pager - [phone]  After 6pm go to [email or web address] - password [email or web address] [facility]  Office  [phone]  CC: Primary care physician; Glendon Axe, MD

## 2019-06-12 NOTE — Plan of Care (Signed)
  Problem: Education: Goal: Knowledge of General Education information will improve Description: Including pain rating scale, medication(s)/side effects and non-pharmacologic comfort measures Outcome: Not Progressing   

## 2019-06-12 NOTE — Progress Notes (Signed)
Patient having coughing spell, neb treatment given with some success.

## 2019-06-13 NOTE — Care Management Important Message (Signed)
Important Message  Patient Details  Name: Katrina Jefferson MRN: [id number] Date of Birth: 07/08/1924   Medicare Important Message Given:  Yes     [name] 06/13/2019, 11:31 AM

## 2019-06-13 NOTE — Progress Notes (Signed)
Follow up on patient who remains on the hospice home waiting list.  No beds available again today.    Dimas Aguas, RN [facility] [phone]

## 2019-06-13 NOTE — Progress Notes (Signed)
[name] at [place] NAME: Katrina Jefferson    MR#:  [id number]  DATE OF BIRTH:  1923/11/22  SUBJECTIVE:   Doing fine this morning. No concerns. States she is not hungry and doesn't want to eat anything. Denies shortness of breath.  REVIEW OF SYSTEMS:  Unable to obtain dementia  DRUG ALLERGIES:   Allergies  Allergen Reactions  . Codeine Sulfate Other (See Comments)    Reaction:  Unknown   . Erythromycin Other (See Comments)    Reaction:  Unknown   . Sulfa Antibiotics Other (See Comments)    Reaction:  Unknown   . Ultracet [Tramadol-Acetaminophen] Other (See Comments)    Reaction:  Unknown     VITALS:  Blood pressure (!) 145/76, pulse 94, temperature 98.2 F (36.8 C), temperature source Oral, resp. rate 18, height 5\' 2"  (1.575 m), weight 62.6 kg, SpO2 97 %.  PHYSICAL EXAMINATION:  Physical Exam   GENERAL:  85 y.o.-year-old elderly patient lying in the bed, thin appearing. EYES: Pupils equal, round, reactive to light and accommodation. No scleral icterus. Extraocular muscles intact.  HEENT: Head atraumatic, normocephalic. + Small amount of white material present on the tongue NECK:  Supple, no jugular venous distention. No thyroid enlargement, no tenderness.  LUNGS: Lungs are clear to auscultation bilaterally.  Nasal cannula in place.  Normal work of breathing. CARDIOVASCULAR: RRR, S1, S2 normal. No  rubs, or gallops. 2/6 systolic murmur present ABDOMEN: Soft, nontender, nondistended. Bowel sounds present. No organomegaly or mass.  EXTREMITIES: No pedal edema, cyanosis, or clubbing.  Left arm in a sling NEUROLOGIC: Cranial nerves II through XII are intact. Following commands. Sensation intact. Gait not checked. +Global weakness  PSYCHIATRIC: The patient is alert. Oriented to person. SKIN: No obvious rash, lesion, or ulcer.    LABORATORY PANEL:   CBC No results for input(s): WBC, HGB, HCT, PLT in the last 168 hours.  ------------------------------------------------------------------------------------------------------------------  Chemistries  No results for input(s): NA, K, CL, CO2, GLUCOSE, BUN, CREATININE, CALCIUM, MG, AST, ALT, ALKPHOS, BILITOT in the last 168 hours.  Invalid input(s): GFRCGP ------------------------------------------------------------------------------------------------------------------  Cardiac Enzymes No results for input(s): TROPONINI in the last 168 hours. ------------------------------------------------------------------------------------------------------------------  RADIOLOGY:  No results found.  EKG:   Orders placed or performed during the hospital encounter of 06/01/19  . ED EKG  . EKG 12-Lead  . EKG 12-Lead  . ED EKG  . EKG 12-Lead  . EKG 12-Lead  . EKG 12-Lead  . EKG 12-Lead  . EKG 12-Lead  . EKG 12-Lead    ASSESSMENT AND PLAN:   Acute hypoxic respiratory failure- requiring 4L O2 by [place].  Chest x-ray negative. -Patient is currently full comfort care, awaiting residential hospice placement. No beds available currently. -Continue scheduled Roxanol with IV morphine prn -Continue Ativan IV as needed -Robinul for increased secretions. -Duonebs prn  Left greater tuberosity fracture of the left proximal humerus and left-sided superior and inferior pubic rami fractures -Evaluated by Ortho, who recommended nonsurgical management -Pain control  Oral thrush- improving -Nystatin qid  Depression- continue Lexapro  Diabetes- no CBGs or insulin due to full comfort care status.  Awaiting placement at residential hospice. No beds available today.  All the records are reviewed and case discussed with Care Management/Social Workerr. Management plans discussed with the patient, family and they are in agreement.  CODE STATUS: DNR  TOTAL TIME TAKING CARE OF THIS PATIENT: 28 minutes.   POSSIBLE D/C unknown, DEPENDING ON CLINICAL CONDITION.   Katrina Jefferson  D Mayo  M.D on 06/13/2019 at 10:05 AM  Between 7am to 6pm - Pager - [phone]  After 6pm go to [email or web address] - password [email or web address] [facility]  Office  [phone]  CC: Primary care physician; Glendon Axe, MD

## 2019-06-14 DIAGNOSIS — Z515 Encounter for palliative care: Secondary | ICD-10-CM

## 2019-06-14 MED ORDER — NYSTATIN 100000 UNIT/ML MT SUSP: 5.0000 mL | Freq: Four times a day (QID) | OROMUCOSAL | 0 refills | Status: AC

## 2019-06-14 MED ORDER — METOPROLOL TARTRATE 25 MG PO TABS: 25.0000 mg | ORAL_TABLET | Freq: Three times a day (TID) | ORAL | 0 refills | Status: AC

## 2019-06-14 MED ORDER — MORPHINE SULFATE (PF) 2 MG/ML IV SOLN: 2.0000 mg | INTRAVENOUS | Status: DC | PRN

## 2019-06-14 MED ORDER — MORPHINE SULFATE (CONCENTRATE) 10 MG/0.5ML PO SOLN: 5.0000 mg | ORAL | 0 refills | Status: AC

## 2019-06-14 MED ORDER — LORAZEPAM 0.5 MG PO TABS: 0.5000 mg | ORAL_TABLET | ORAL | 0 refills | Status: AC | PRN

## 2019-06-14 NOTE — Progress Notes (Signed)
[place] at [place] NAME: Katrina Jefferson    MR#:  [id number]  DATE OF BIRTH:  Nov 26, 1923  SUBJECTIVE:   - patient is alert today, following simple commands.  Appears comfortable.  Awaiting hospice on bed  REVIEW OF SYSTEMS:  Unable to obtain dementia  DRUG ALLERGIES:   Allergies  Allergen Reactions  . Codeine Sulfate Other (See Comments)    Reaction:  Unknown   . Erythromycin Other (See Comments)    Reaction:  Unknown   . Sulfa Antibiotics Other (See Comments)    Reaction:  Unknown   . Ultracet [Tramadol-Acetaminophen] Other (See Comments)    Reaction:  Unknown     VITALS:  Blood pressure 140/60, pulse 100, temperature 98 F (36.7 C), resp. rate 18, height 5\' 2"  (1.575 m), weight 62.6 kg, SpO2 97 %.  PHYSICAL EXAMINATION:  Physical Exam   GENERAL:  85 y.o.-year-old elderly patient lying in the bed, thin appearing.  Chronically ill-appearing. EYES: Pupils equal, round, reactive to light and accommodation. No scleral icterus. Extraocular muscles intact.  HEENT: Head atraumatic, normocephalic.  Oral thrush present NECK:  Supple, no jugular venous distention. No thyroid enlargement, no tenderness.  LUNGS: Lungs are clear to auscultation bilaterally.  Nasal cannula in place.  Normal work of breathing. CARDIOVASCULAR: RRR, S1, S2 normal. No  rubs, or gallops. 2/6 systolic murmur present ABDOMEN: Soft, nontender, nondistended. Bowel sounds present. No organomegaly or mass.  EXTREMITIES: No pedal edema, cyanosis, or clubbing.  Left arm in a sling NEUROLOGIC: Cranial nerves II through XII are intact. Following commands. Sensation intact. Gait not checked. +Global weakness  PSYCHIATRIC: The patient is alert. Oriented to person. SKIN: No obvious rash, lesion, or ulcer.    LABORATORY PANEL:   CBC No results for input(s): WBC, HGB, HCT, PLT in the last 168 hours.  ------------------------------------------------------------------------------------------------------------------  Chemistries  No results for input(s): NA, K, CL, CO2, GLUCOSE, BUN, CREATININE, CALCIUM, MG, AST, ALT, ALKPHOS, BILITOT in the last 168 hours.  Invalid input(s): GFRCGP ------------------------------------------------------------------------------------------------------------------  Cardiac Enzymes No results for input(s): TROPONINI in the last 168 hours. ------------------------------------------------------------------------------------------------------------------  RADIOLOGY:  No results found.  EKG:   Orders placed or performed during the hospital encounter of 06/01/19  . ED EKG  . EKG 12-Lead  . EKG 12-Lead  . ED EKG  . EKG 12-Lead  . EKG 12-Lead  . EKG 12-Lead  . EKG 12-Lead  . EKG 12-Lead  . EKG 12-Lead    ASSESSMENT AND PLAN:   85 year old female with past medical history significant for diabetes, osteoporosis, hyperlipidemia, retinal hemorrhage, COPD not on home oxygen presents to hospital secondary to a fall and noted to have left humeral fracture.  1. Acute hypoxic respiratory failure- requiring 2L O2 by [place].  Chest x-ray negative. -Patient is currently full comfort care, awaiting residential hospice placement. No beds available currently. -Continue scheduled Roxanol with IV morphine prn -Continue Ativan IV as needed -Robinul for increased secretions. -Duonebs prn  2. Left greater tuberosity fracture of the left proximal humerus and left-sided superior and inferior pubic rami fractures -Evaluated by Ortho, who recommended nonsurgical management -Pain control  3. Oral thrush- improving -Nystatin qid  4. Depression- continue Lexapro  5. Diabetes- no CBGs or insulin due to full comfort care status.  Patient is alert and following commands today.  She feels like she can make it to her 85rd birthday which is on June 25, 2017.  Appears to  be comfortable. Awaiting placement at  residential hospice. No beds available today.  All the records are reviewed and case discussed with Care Management/Social Workerr. Management plans discussed with the patient, family and they are in agreement.  CODE STATUS: DNR  TOTAL TIME TAKING CARE OF THIS PATIENT: 28 minutes.   POSSIBLE D/C unknown, DEPENDING ON CLINICAL CONDITION.   Gladstone Lighter M.D on 06/14/2019 at 9:04 AM  Between 7am to 6pm - Pager - [phone]  After 6pm go to [email or web address] - password [email or web address] [facility]  Office  [phone]  CC: Primary care physician; Glendon Axe, MD

## 2019-06-14 NOTE — TOC Transition Note (Signed)
Transition of Care [facility]) - CM/SW Discharge Note   Patient Details  Name: Katrina Jefferson MRN: [id number] Date of Birth: 02-Jul-1924  Transition [facility] Care [facility]) CM/SW Contact:  Su Hilt, RN Phone Number: 06/14/2019, 10:00 AM   Clinical Narrative:     Patient to DC to Hospice facility in [place] today, Santiago Glad with [facility] letting Niece know,  DC packet on the chart, will transport via EMS  Final next level of care: [place]     Patient Goals and CMS Choice        Discharge Placement                       Discharge Plan and Services                                     Social Determinants of Health (SDOH) Interventions     Readmission Risk Interventions No flowsheet data found.

## 2019-06-14 NOTE — Discharge Summary (Signed)
[facility] [place] NAME: Katrina Jefferson    MR#:  [id number]  DATE OF BIRTH:  Aug 27, 1924  DATE OF ADMISSION:  06/01/2019   ADMITTING PHYSICIAN: Christel Mormon, MD  DATE OF DISCHARGE:  06/14/19  PRIMARY CARE PHYSICIAN: Glendon Axe, MD   ADMISSION DIAGNOSIS:   Fall [W19.XXXA] Closed fracture of head of left humerus, initial encounter [S42.292A] Fall, initial encounter [W19.XXXA] Closed fracture of multiple pubic rami, left, initial encounter ([place]) [S32.592A]  DISCHARGE DIAGNOSIS:   Active Problems:   Bilateral pubic rami fractures [facility])   Hospice care patient   SECONDARY DIAGNOSIS:   Past Medical History:  Diagnosis Date  . Adrenal mass, left ([place])   . Arthritis   . COPD (chronic obstructive pulmonary disease) ([place])   . Depression   . Diabetes mellitus without complication ([place])   . Hyperlipidemia   . Insomnia   . Osteoporosis   . Retinal hemorrhage     HOSPITAL COURSE:   85 year old female with past medical history significant for diabetes, osteoporosis, hyperlipidemia, retinal hemorrhage, COPD not on home oxygen presents to hospital secondary to a fall and noted to have left humeral fracture.  1. Acute hypoxic respiratory failure- requiring 2L O2 by [place].  Chest x-ray negative. -Patient is currently full comfort care, awaiting residential hospice placement. No beds available currently. -Continue scheduled Roxanol -Continue Ativan as needed  2. Left greater tuberosity fracture of the left proximal humerus and left-sided superior and inferior pubic rami fractures -Evaluated by Ortho, who recommended nonsurgical management -Pain control  3. Oral thrush- improving -Nystatin qid  4. Depression- continue Lexapro  5. Diabetes- no CBGs or insulin due to full comfort care status.  Patient is alert and following commands today.  She feels like she can make it to her 89rd birthday which is on June 25, 2017.   Appears to be comfortable. Will be discharged to hospice home today  DISCHARGE CONDITIONS:   Very critical  CONSULTS OBTAINED:    None  DRUG ALLERGIES:   Allergies  Allergen Reactions  . Codeine Sulfate Other (See Comments)    Reaction:  Unknown   . Erythromycin Other (See Comments)    Reaction:  Unknown   . Sulfa Antibiotics Other (See Comments)    Reaction:  Unknown   . Ultracet [Tramadol-Acetaminophen] Other (See Comments)    Reaction:  Unknown    DISCHARGE MEDICATIONS:   Allergies as of 06/14/2019      Reactions   Codeine Sulfate Other (See Comments)   Reaction:  Unknown    Erythromycin Other (See Comments)   Reaction:  Unknown    Sulfa Antibiotics Other (See Comments)   Reaction:  Unknown    Ultracet [tramadol-acetaminophen] Other (See Comments)   Reaction:  Unknown       Medication List    STOP taking these medications   albuterol 108 (90 Base) MCG/ACT inhaler Commonly known as: VENTOLIN HFA   amLODipine 2.5 MG tablet Commonly known as: NORVASC   etodolac 500 MG 24 hr tablet Commonly known as: LODINE XL   HYDROcodone-acetaminophen 5-325 MG tablet Commonly known as: NORCO/VICODIN   loratadine 10 MG tablet Commonly known as: CLARITIN   metFORMIN 1000 MG tablet Commonly known as: GLUCOPHAGE   omeprazole 40 MG capsule Commonly known as: PRILOSEC   promethazine 25 MG tablet Commonly known as: PHENERGAN   simvastatin 40 MG tablet Commonly known as: ZOCOR   traZODone 100 MG tablet Commonly known as: DESYREL  vitamin B-12 1000 MCG tablet Commonly known as: CYANOCOBALAMIN     TAKE these medications   acetaminophen 500 MG tablet Commonly known as: TYLENOL Take 1,000 mg by mouth every 6 (six) hours as needed for mild pain, fever or headache.   escitalopram 20 MG tablet Commonly known as: LEXAPRO Take 20 mg by mouth daily.   LORazepam 0.5 MG tablet Commonly known as: ATIVAN Take 1 tablet (0.5 mg total) by mouth every 4 (four) hours as  needed for anxiety.   metoprolol tartrate 25 MG tablet Commonly known as: LOPRESSOR Take 1 tablet (25 mg total) by mouth every 8 (eight) hours.   morphine CONCENTRATE 10 MG/0.5ML Soln concentrated solution Take 0.25 mLs (5 mg total) by mouth every 4 (four) hours.   nystatin 100000 UNIT/ML suspension Commonly known as: MYCOSTATIN Take 5 mLs (500,000 Units total) by mouth 4 (four) times daily.        DISCHARGE INSTRUCTIONS:   1.  Patient being discharged to hospice home today  DIET:   Regular diet  ACTIVITY:   Activity as tolerated  OXYGEN:   Home Oxygen: Yes.    Oxygen Delivery: 2 liters/min via Patient connected to nasal cannula oxygen  DISCHARGE LOCATION:   Hospice home  If you experience worsening of your admission symptoms, develop shortness of breath, life threatening emergency, suicidal or homicidal thoughts you must seek medical attention immediately by calling 911 or calling your MD immediately  if symptoms less severe.  You Must read complete instructions/literature along with all the possible adverse reactions/side effects for all the Medicines you take and that have been prescribed to you. Take any new Medicines after you have completely understood and accpet all the possible adverse reactions/side effects.   Please note  You were cared for by a hospitalist during your hospital stay. If you have any questions about your discharge medications or the care you received while you were in the hospital after you are discharged, you can call the unit and asked to speak with the hospitalist on call if the hospitalist that took care of you is not available. Once you are discharged, your primary care physician will handle any further medical issues. Please note that NO REFILLS for any discharge medications will be authorized once you are discharged, as it is imperative that you return to your primary care physician (or establish a relationship with a primary care physician  if you do not have one) for your aftercare needs so that they can reassess your need for medications and monitor your lab values.    On the day of Discharge:  VITAL SIGNS:   Blood pressure 140/60, pulse 100, temperature 98 F (36.7 C), resp. rate 18, height 5\' 2"  (1.575 m), weight 62.6 kg, SpO2 97 %.  PHYSICAL EXAMINATION:    GENERAL:  85 y.o.-year-old elderly patient lying in the bed, thin appearing.  Chronically ill-appearing. EYES: Pupils equal, round, reactive to light and accommodation. No scleral icterus. Extraocular muscles intact.  HEENT: Head atraumatic, normocephalic.  Oral thrush present NECK:  Supple, no jugular venous distention. No thyroid enlargement, no tenderness.  LUNGS: Lungs are clear to auscultation bilaterally.  Nasal cannula in place.  Normal work of breathing. CARDIOVASCULAR: RRR, S1, S2 normal. No  rubs, or gallops. 2/6 systolic murmur present ABDOMEN: Soft, nontender, nondistended. Bowel sounds present. No organomegaly or mass.  EXTREMITIES: No pedal edema, cyanosis, or clubbing.  Left arm in a sling NEUROLOGIC: Cranial nerves II through XII are intact. Following commands. Sensation intact.  Gait not checked. +Global weakness  PSYCHIATRIC: The patient is alert. Oriented to person. SKIN: No obvious rash, lesion, or ulcer.   DATA REVIEW:   CBC No results for input(s): WBC, HGB, HCT, PLT in the last 168 hours.  Chemistries  No results for input(s): NA, K, CL, CO2, GLUCOSE, BUN, CREATININE, CALCIUM, MG, AST, ALT, ALKPHOS, BILITOT in the last 168 hours.  Invalid input(s): [place]   Microbiology Results  Results for orders placed or performed during the hospital encounter of 06/01/19  SARS CORONAVIRUS 2 (TAT 6-12 HRS) Nasal Swab Aptima Multi Swab     Status: None   Collection Time: 06/02/19 12:00 PM   Specimen: Aptima Multi Swab; Nasal Swab  Result Value Ref Range Status   SARS Coronavirus 2 NEGATIVE NEGATIVE Final    Comment: (NOTE) SARS-CoV-2 target  nucleic acids are NOT DETECTED. The SARS-CoV-2 RNA is generally detectable in upper and lower respiratory specimens during the acute phase of infection. Negative results do not preclude SARS-CoV-2 infection, do not rule out co-infections with other pathogens, and should not be used as the sole basis for treatment or other patient management decisions. Negative results must be combined with clinical observations, patient history, and epidemiological information. The expected result is Negative. Fact Sheet for Patients: SugarRoll.be Fact Sheet for Healthcare Providers: https://www.woods-mathews.com/ This test is not yet approved or cleared by the [place] FDA and  has been authorized for detection and/or diagnosis of SARS-CoV-2 by FDA under an Emergency Use Authorization (EUA). This EUA will remain  in effect (meaning this test can be used) for the duration of the COVID-19 declaration under Section 56 4(b)(1) of the Act, 21 U.S.C. section 360bbb-3(b)(1), unless the authorization is terminated or revoked sooner. Performed at [facility], [place] [address], [place], [place] [address]     RADIOLOGY:  No results found.   Management plans discussed with the patient, family and they are in agreement.  CODE STATUS:     Code Status Orders  (From admission, onward)         Start     Ordered   06/02/19 0918  Do not attempt resuscitation (DNR)  Continuous    Question Answer Comment  In the event of cardiac or respiratory ARREST Do not call a "code blue"   In the event of cardiac or respiratory ARREST Do not perform Intubation, CPR, defibrillation or ACLS   In the event of cardiac or respiratory ARREST Use medication by any route, position, wound care, and other measures to relive pain and suffering. May use oxygen, suction and manual treatment of airway obstruction as needed for comfort.      06/02/19 0917        Code Status  History    Date Active Date Inactive Code Status Order ID Comments User Context   06/02/2019 0016 06/02/2019 0917 Full Code [id number]  Mayer Camel, NP ED   11/28/2015 0801 11/30/2015 2005 DNR [id number]  Lance Coon, MD Inpatient   Advance Care Planning Activity      TOTAL TIME TAKING CARE OF THIS PATIENT: 39 minutes.    Gladstone Lighter M.D on 06/14/2019 at 9:58 AM  Between 7am to 6pm - Pager - [phone]  After 6pm go to [email or web address] - Proofreader  [facility]  Office  [phone]  CC: Primary care physician; Glendon Axe, MD   Note: This dictation was prepared with Dragon dictation along with smaller phrase technology. Any transcriptional errors that result from this  process are unintentional.

## 2019-06-14 NOTE — Progress Notes (Signed)
Hospice home bed has become available. Writer notified patient's niece [name] and hospital care team. Discharge summary faxed to referral. Patient lying in bed, alert able to communicate needs, slow to respond. Breakfast tray at bedside, patient continues with only bites and sips. She has not required any PRN medication in the last 24 hrs, scheduled roxanol continues at 5 mg q 4 hrs. Patient appeared comfortable and denied pain during visit. Urine out put decreased, urine dark amber in container. Report called to the hospice home and EMS notified for transport. Hospital care team updated.  Flo Shanks BSN, RN, [facility] [phone]

## 2019-06-14 NOTE — Plan of Care (Signed)
  Problem: Elimination: Goal: Will not experience complications related to bowel motility Outcome: Progressing Goal: Will not experience complications related to urinary retention Outcome: Progressing   

## 2019-07-07 DEATH — deceased
